# Patient Record
Sex: Male | Born: 1965 | Race: Black or African American | Hispanic: No | Marital: Married | Smoking: Former smoker
Health system: Southern US, Community
[De-identification: ages and names within clinical notes are randomized; demographics above are authoritative.]

## PROBLEM LIST (undated history)

## (undated) DIAGNOSIS — N529 Male erectile dysfunction, unspecified: Secondary | ICD-10-CM

## (undated) DIAGNOSIS — F1910 Other psychoactive substance abuse, uncomplicated: Secondary | ICD-10-CM

## (undated) DIAGNOSIS — A64 Unspecified sexually transmitted disease: Secondary | ICD-10-CM

## (undated) HISTORY — PX: VASECTOMY: SHX75

## (undated) HISTORY — PX: OTHER SURGICAL HISTORY: SHX169

## (undated) HISTORY — DX: Unspecified sexually transmitted disease: A64

## (undated) HISTORY — DX: Other psychoactive substance abuse, uncomplicated: F19.10

## (undated) HISTORY — DX: Male erectile dysfunction, unspecified: N52.9

---

## 1989-10-23 HISTORY — PX: OTHER SURGICAL HISTORY: SHX169

## 1993-10-23 DIAGNOSIS — F1910 Other psychoactive substance abuse, uncomplicated: Secondary | ICD-10-CM

## 1993-10-23 HISTORY — DX: Other psychoactive substance abuse, uncomplicated: F19.10

## 2009-10-27 ENCOUNTER — Ambulatory Visit: Payer: Self-pay | Admitting: Internal Medicine

## 2009-10-27 DIAGNOSIS — N529 Male erectile dysfunction, unspecified: Secondary | ICD-10-CM

## 2009-10-27 DIAGNOSIS — K649 Unspecified hemorrhoids: Secondary | ICD-10-CM | POA: Insufficient documentation

## 2009-10-27 DIAGNOSIS — R5383 Other fatigue: Secondary | ICD-10-CM

## 2009-10-27 DIAGNOSIS — R5381 Other malaise: Secondary | ICD-10-CM | POA: Insufficient documentation

## 2009-10-27 LAB — CONVERTED CEMR LAB
ALT: 14 units/L (ref 0–53)
BUN: 13 mg/dL (ref 6–23)
Basophils Absolute: 0 10*3/uL (ref 0.0–0.1)
Bilirubin, Direct: 0.2 mg/dL (ref 0.0–0.3)
Chloride: 105 meq/L (ref 96–112)
Cholesterol: 176 mg/dL (ref 0–200)
Eosinophils Relative: 2 % (ref 0–5)
Free T4: 1.16 ng/dL (ref 0.80–1.80)
Glucose, Bld: 87 mg/dL (ref 70–99)
HCT: 45.6 % (ref 39.0–52.0)
Hemoglobin: 15.5 g/dL (ref 13.0–17.0)
LDL Cholesterol: 114 mg/dL — ABNORMAL HIGH (ref 0–99)
Lymphocytes Relative: 54 % — ABNORMAL HIGH (ref 12–46)
Lymphs Abs: 2.1 10*3/uL (ref 0.7–4.0)
Monocytes Absolute: 0.4 10*3/uL (ref 0.1–1.0)
Neutro Abs: 1.3 10*3/uL — ABNORMAL LOW (ref 1.7–7.7)
Potassium: 4.4 meq/L (ref 3.5–5.3)
RBC: 5.28 M/uL (ref 4.22–5.81)
RDW: 13.7 % (ref 11.5–15.5)
Sodium: 140 meq/L (ref 135–145)
TSH: 2.611 microintl units/mL (ref 0.350–4.500)
Total CHOL/HDL Ratio: 3.5
Total Protein: 7.2 g/dL (ref 6.0–8.3)
Triglycerides: 59 mg/dL (ref <150)
VLDL: 12 mg/dL (ref 0–40)
WBC: 3.9 10*3/uL — ABNORMAL LOW (ref 4.0–10.5)

## 2009-10-28 ENCOUNTER — Encounter: Payer: Self-pay | Admitting: Internal Medicine

## 2009-10-28 ENCOUNTER — Telehealth: Payer: Self-pay | Admitting: Internal Medicine

## 2010-01-19 ENCOUNTER — Ambulatory Visit: Payer: Self-pay | Admitting: Internal Medicine

## 2010-01-19 DIAGNOSIS — J309 Allergic rhinitis, unspecified: Secondary | ICD-10-CM | POA: Insufficient documentation

## 2010-01-19 DIAGNOSIS — D696 Thrombocytopenia, unspecified: Secondary | ICD-10-CM | POA: Insufficient documentation

## 2010-01-19 LAB — CONVERTED CEMR LAB
Basophils Absolute: 0 10*3/uL (ref 0.0–0.1)
Basophils Relative: 0 % (ref 0–1)
Eosinophils Absolute: 0.1 10*3/uL (ref 0.0–0.7)
MCHC: 33.9 g/dL (ref 30.0–36.0)
MCV: 85.7 fL (ref 78.0–100.0)
Neutro Abs: 1.4 10*3/uL — ABNORMAL LOW (ref 1.7–7.7)
Neutrophils Relative %: 37 % — ABNORMAL LOW (ref 43–77)
Platelets: 137 10*3/uL — ABNORMAL LOW (ref 150–400)

## 2010-01-30 ENCOUNTER — Telehealth: Payer: Self-pay | Admitting: Internal Medicine

## 2010-01-30 DIAGNOSIS — D709 Neutropenia, unspecified: Secondary | ICD-10-CM

## 2010-01-31 ENCOUNTER — Encounter: Payer: Self-pay | Admitting: Internal Medicine

## 2010-06-22 ENCOUNTER — Telehealth: Payer: Self-pay | Admitting: Internal Medicine

## 2010-07-06 ENCOUNTER — Ambulatory Visit: Payer: Self-pay | Admitting: Internal Medicine

## 2010-07-06 DIAGNOSIS — M25519 Pain in unspecified shoulder: Secondary | ICD-10-CM

## 2010-07-07 ENCOUNTER — Telehealth: Payer: Self-pay | Admitting: Internal Medicine

## 2010-07-11 ENCOUNTER — Encounter: Payer: Self-pay | Admitting: Internal Medicine

## 2010-11-22 NOTE — Progress Notes (Addendum)
Summary: Rx request  Phone Note Call from Patient Call back at Home Phone [phone]   Caller: Patient Reason for Call: Talk to Nurse Summary of Call: Pt is requesting Rx for Zyrtec, is complaining of allergy symptoms; congestion Initial call taken by: Lannette Donath,  June 22, 2010 9:40 AM  Follow-up for Phone Call        call returned to patient at [phone], no answer. A voice message was left for patient to return call with symptoms that he is having.  Follow-up by: Glendell Docker CMA,  June 22, 2010 10:45 AM  Additional Follow-up for Phone Call Additional follow up Details #1::        patient is complaining nasal congestion, eyes puffiness, he states he does not think that he has allergy, but has taken Zyrtec in the past and used a nasal spray with relief. He would like to know if her could get a rx for zyrtec due to the otc cost, and a refil on the nasal spray Additional Follow-up by: Glendell Docker CMA,  June 22, 2010 11:02 AM    Additional Follow-up for Phone Call Additional follow up Details #2::    Fluticasone and zyrtec sent to pharmacy.  Pt should call if he develops fever, visual changes, if symptoms worsen or if they do not improve. Lemont Fillers FNP,  June 22, 2010 1:55 PM  Left message on machine to return my call. Nicki Guadalajara Fergerson CMA Duncan Dull)  June 22, 2010 3:35 PM   Pt returned my call and was notified per Provider's instructions. Nicki Guadalajara Fergerson CMA (AAMA)  June 22, 2010 3:42 PM   New/Updated Medications: ZYRTEC ALLERGY 10 MG TABS (CETIRIZINE HCL) one cap by mouth daily Prescriptions: ZYRTEC ALLERGY 10 MG TABS (CETIRIZINE HCL) one cap by mouth daily  #30 x 1   Entered and Authorized by:   Lemont Fillers FNP   Signed by:   Lemont Fillers FNP on 06/22/2010   Method used:   Electronically to        [facility] [id number] (retail)       [address] [place], [place]  [address]       Ph: [phone]    Fax: [phone]   RxID:   [id number] FLUTICASONE PROPIONATE 50 MCG/ACT SUSP (FLUTICASONE PROPIONATE) 2 sprays each nostril once daily  #1 x 0   Entered and Authorized by:   Lemont Fillers FNP   Signed by:   Lemont Fillers FNP on 06/22/2010   Method used:   Electronically to        [facility] [address] (retail)       [address]       [place], [place]  [address]       Ph: [phone]       Fax: [phone]   RxID:   [id number]

## 2010-11-22 NOTE — Assessment & Plan Note (Addendum)
Summary: 3 month follow up/mhf wife cancelled original appt in error   Vital Signs:  Patient profile:   45 year old Bruce [name] Height:      70.25 inches Weight:      165.25 pounds BMI:     23.63 O2 Sat:      98 % on Room air Temp:     98.0 degrees F oral Pulse rate:   56 / minute Pulse rhythm:   regular Resp:     16 per minute BP sitting:   110 / 80  (right arm) Cuff size:   large  Vitals Entered By: Glendell Docker CMA (January 19, 2010 9:08 AM)  O2 Flow:  Room air CC: Rm 3- 3 Month Follow up, URI symptoms Comments c/o difficulty sleeping due to sinus congestion, revisit ED concerns   Primary Care Provider:  DThomos Lemons DO  CC:  Rm 3- 3 Month Follow up and URI symptoms.  History of Present Illness:  URI Symptoms      This is a 45 year old man who presents with URI symptoms.  The patient reports nasal congestion, but denies purulent nasal discharge, sore throat, and earache.  The patient also reports seasonal symptoms.  He has difficulty sleeping at night due to nasal congestion.  ED - testosterone level is normal.   still having occ problems with erection quality.    reviewed prev labs - he has mild thrombocytopenia.  asymptomatic  Allergies (verified): No Known Drug Allergies  Past History:  Past Medical History: Unremarkable     Family History: Family History of CAD Bruce [name] 1st degree relative <Bruce Family History Hypertension Family History Kidney disease     Social History: Occupation: Insurance claims handler Married 14 years (wife Metallurgist) 5 daughters 39, 70, 8, 3,1  2 boys 11, 4     Physical Exam  General:  alert, well-developed, and well-nourished.   Ears:  R ear normal and L ear normal.   Nose:  nasal dischargemucosal pallor and mucosal edema.   Mouth:  postnasal drip and pharyngeal crowding.   Neck:  No deformities, masses, or tenderness noted. Lungs:  normal respiratory effort, normal breath sounds, no crackles, and no wheezes.   Heart:  normal rate, regular  rhythm, no murmur, and no gallop.     Impression & Recommendations:  Problem # 1:  ALLERGIC RHINITIS (ICD-477.9) Pt with chronic nasal congestion.   use otc zyrtec and flonase.  Patient advised to call office if symptoms persist or worsen.  His updated medication list for this problem includes:    Fluticasone Propionate Bruce Mcg/act Susp (Fluticasone propionate) .Marland Kitchen... 2 sprays each nostril once daily  Problem # 2:  THROMBOCYTOPENIA (ICD-287.5) Pt with mild thrombocytopenia.  repeat CBC Orders: T-CBC w/Diff ([id number])  Problem # 3:  ERECTILE DYSFUNCTION, ORGANIC (ICD-607.84) Testosterone levels normal.  Pt to consider trial of viagra or similar agent.  observe for now.  Complete Medication List: 1)  Fluticasone Propionate Bruce Mcg/act Susp (Fluticasone propionate) .... 2 sprays each nostril once daily  Patient Instructions: 1)  Use over the counter zyrtec 10 mg at bedtime daily 2)  Use breathe right nose strips. 3)  Call our office if your symptoms do not  improve or gets worse. 4)  Please schedule a follow-up appointment in 6 months. Prescriptions: FLUTICASONE PROPIONATE Bruce MCG/ACT SUSP (FLUTICASONE PROPIONATE) 2 sprays each nostril once daily  #1 x 3   Entered and Authorized by:   D. Thomos Lemons DO   Signed by:  Dondra Spry DO on 01/19/2010   Method used:   Electronically to        [facility] [address] (retail)       [address] [place], [place]  [address]       Ph: [phone]       Fax: [phone]   RxID:   [id number]   Current Allergies (reviewed today): No known allergies

## 2010-11-22 NOTE — Assessment & Plan Note (Addendum)
Summary: new to be est. - jr   Vital Signs:  Patient profile:   45 year old male Height:      70.25 inches Weight:      164.50 pounds BMI:     23.52 O2 Sat:      99 % on Room air Temp:     97.5 degrees F oral Pulse rate:   54 / minute Pulse rhythm:   regular Resp:     16 per minute BP sitting:   118 / 66  (right arm) Cuff size:   large  Vitals Entered By: Glendell Docker CMA (October 27, 2009 8:35 AM)  O2 Flow:  Room air  Primary Care Provider:  D. Thomos Lemons DO  CC:  New Patient.  History of Present Illness: New Patient  45 y/o AA male to establish.   no significant medical hx.   He works as Insurance claims handler.  He reports recent hx of possible ring warm on his face.  He washed with dandruff shampoo.  rash resolved.  he c/o chronic fatigue.   no chest pain or sob.    no sign wt change and change in appetite.   mild ED - difficulty with maintaining erection.  Preventive Screening-Counseling & Management  Alcohol-Tobacco     Alcohol drinks/day: 0     Alcohol Counseling: not indicated; patient does not drink     Smoking Status: quit     Packs/Day: 0.25     Year Started: 1986     Year Quit: 1995     Tobacco Counseling: not to resume use of tobacco products  Caffeine-Diet-Exercise     Caffeine use/day: 1beverage every other day     Caffeine Counseling: not indicated; caffeine use is not excessive or problematic     Does Patient Exercise: no  Allergies (verified): No Known Drug Allergies  Past History:  Past Medical History: Unremarkable  Family History: Family History of CAD Male 1st degree relative <50 Family History Hypertension Family History Kidney disease    Social History: 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         he has hx of hemorrhoids.   some intermittent blood on tissue paper  Physical Exam  General:  alert, well-developed, and well-nourished.   Head:  normocephalic and atraumatic.   Eyes:  pupils equal, pupils round, and pupils reactive to light.   Ears:  R ear normal and L ear normal.   Mouth:  Oral mucosa and oropharynx without lesions or exudates.  Teeth in good repair. Neck:  No deformities, masses, or tenderness noted.no carotid bruits.   Lungs:  Normal respiratory effort, chest expands symmetrically. Lungs are clear to auscultation, no crackles or wheezes. Heart:  Normal rate and regular rhythm. S1 and S2 normal without gallop, murmur, click, rub or other extra sounds. Abdomen:  soft, non-tender, normal bowel sounds, no masses, no hepatomegaly, and no splenomegaly.   Genitalia:  circumcised, no scrotal masses, no testicular masses or atrophy, and no urethral discharge.   Extremities:  No lower extremity edema  Skin:  color normal, no rashes, and no suspicious lesions.   Psych:  normally interactive, good  eye contact, not anxious appearing, and not depressed appearing.     Impression & Recommendations:  Problem # 1:  HEALTH MAINTENANCE EXAM (ICD-V70.0) Reviewed adult health maintenance protocols.   We discussed pros and cons of prostate ca screening.    If he elects screening - start age 45.  routine colonoscopy at age 45.  Problem # 2:  FATIGUE, CHRONIC (ICD-780.79) rule out metabolic disorder  Orders: T-Basic Metabolic Panel [phone]) T-Hepatic Function [id number]) T-Lipid Profile [id number]) T-CBC w/Diff [id number]) T-TSH [id number]) T-HIV Antibody  (Reflex) [id number]) T-T4, Free [id number])  Problem # 3:  ERECTILE DYSFUNCTION, ORGANIC (ICD-607.84) Pt with mild ED - poor erection quality.  check testosterone level.  if normal, we  discussed use of viagra.  Orders: T-Testosterone; Total [phone])  Problem # 4:  HEMORRHOIDS (ICD-455.6) Pt with intermittent blood on tissue.  Pt to start bult laxative.  pt advised to avoid straining.  complete stool cards in 2-4 wks when hemorrhoids improved.   Patient Instructions: 1)  Please schedule a follow-up appointment in 2 months. 2)  complete hemeoccult cards 3)  use bulk laxative daily 4)  increase fluid intake    Current Allergies (reviewed today): No known allergies

## 2010-11-22 NOTE — Assessment & Plan Note (Addendum)
Summary: 6 month follow up/mhf   Vital Signs:  Patient profile:   45 year old male Height:      70.25 inches Weight:      164.50 pounds BMI:     23.52 O2 Sat:      99 % on Room air Temp:     98.0 degrees F oral Pulse rate:   57 / minute Pulse rhythm:   regular Resp:     16 per minute BP sitting:   114 / 74  (right arm) Cuff size:   regular  Vitals Entered By: Glendell Docker CMA (July 06, 2010 9:04 AM)  O2 Flow:  Room air CC: 6 Month Follow up  Is Patient Diabetic? No Pain Assessment Patient in pain? no      Comments c/o bilateral shoulder pain in the morning for about 30 minutes, flu shot to be given with employer, refill on nasal spray   Primary Care Provider:  Dondra Spry DO  CC:  6 Month Follow up .  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   Lungs:  normal respiratory effort, normal breath sounds, no crackles, and no wheezes.   Heart:  normal rate, regular rhythm, no murmur, and no gallop.   Msk:  bilaterl shoulders:  normal ROM, no joint tenderness, no joint swelling, no joint warmth, no redness over joints, and no joint instability.     Impression & Recommendations:  Problem # 1:  SHOULDER PAIN,  BILATERAL (ICD-719.41) 45 y/o with AM shoulder stiffness.  no weakness on exam.   trial of OTC ibuprofen.  I doubt RA.  check labs. if persistent or worsening symptoms we discussed referral to rheumatologist  Orders: T-Antinuclear Antib (ANA) [id number]) T-C-Reactive Protein [id number]) T-Sed Rate (Automated) [id number]) T-Rheumatoid Factor [id number]) T-CK Total [phone])  Complete Medication List: 1)  Fluticasone Propionate 50 Mcg/act Susp (Fluticasone propionate) .... 2 sprays each nostril once daily  Patient Instructions: 1)  Call our office if your symptoms do not  improve or gets worse. 2)  Take ibuprofen 400 mg two times a day as needed with food and plenty of water x 1 week.  Current Allergies (reviewed today): No known allergies

## 2010-11-22 NOTE — Progress Notes (Addendum)
Summary: Lab Results  Phone Note Outgoing Call   Summary of Call: call pt - HIV screen is negative.  other labs normal.  I will send letter re: lab results Initial call taken by: D. Thomos Lemons DO,  October 28, 2009 10:23 AM  Follow-up for Phone Call        attempted to contact patient at [phone], no answer, no voice mail Follow-up by: Glendell Docker CMA,  October 28, 2009 2:05 PM  Additional Follow-up for Phone Call Additional follow up Details #1::        attempted to contacted patient wife states patient is not available. Message left for patient to return call Additional Follow-up by: Glendell Docker CMA,  October 29, 2009 1:00 PM    Additional Follow-up for Phone Call Additional follow up Details #2::    patient advised per Dr Artist Pais instructions Follow-up by: Glendell Docker CMA,  October 29, 2009 5:36 PM

## 2010-11-22 NOTE — Progress Notes (Addendum)
Summary: Lab Results  Phone Note Outgoing Call   Summary of Call: call pt - white blood cells and platelet count still slightly decreased.   I suggest we repeat in 6 months.   at next lab draw - add Hep B and Hep C antibody as well as cbcd Initial call taken by: D. Thomos Lemons DO,  January 30, 2010 7:30 AM  Follow-up for Phone Call        attempted to contact patient at [phone], recording rached stating the number had been disconnected. Letter has been mailed to patient  Follow-up by: Glendell Docker CMA,  January 31, 2010 9:28 AM  New Problems: NEUTROPENIA UNSPECIFIED (ICD-288.00)   New Problems: NEUTROPENIA UNSPECIFIED (ICD-288.00)

## 2010-11-22 NOTE — Letter (Addendum)
   [place] at [facility] [address] [place], [place]  [address]  [place] Phone: [phone]      October 28, 2009   [facility] [address] [place], [place] [address]  RE:  LAB RESULTS  Dear  Mr. Markgraf,  The following is an interpretation of your most recent lab tests.  Please take note of any instructions provided or changes to medications that have resulted from your lab work.  ELECTROLYTES:  Good - no changes needed  KIDNEY FUNCTION TESTS:  Good - no changes needed  LIVER FUNCTION TESTS:  Stable - no changes needed, Fair - review at your next visit  LIPID PANEL:  Good - no changes needed Triglyceride: 59   Cholesterol: 176   LDL: 114   HDL: 50   Chol/HDL%:  3.5 Ratio  THYROID STUDIES:  Thyroid studies normal TSH: 2.611     CBC:  Fair - review at your next visit   Testosterone level - normal       Sincerely Yours,    Dr. Thomos Lemons

## 2010-11-22 NOTE — Letter (Addendum)
Summary: Generic Letter  [place] at [facility]  [address]   [place], [place] [address]   Phone: [phone]  Fax: [phone]      01/31/2010      [facility] [address] [place], [place]  [address]        Dear Mr. Shehan,  I have attempted to contact you at [phone], however a voice recording was reached stating the number has been disconnected. Dr Artist Pais asked that you be informed that your white blood cells and platelet count is still slightly decreased. He would like for you to return in 6 months for blood work. Please contact our office with any questions and  to arrange this lab work.         Sincerely,   Glendell Docker CMA Dr. Thomos Lemons

## 2010-11-22 NOTE — Letter (Addendum)
Summary: Generic Letter  [place] at [facility]  [address]   [place], [place] [address]   Phone: [phone]  Fax: [phone]      07/11/2010    Taryll Krog [address] [place], [place]  [address]   Dear Mr. Cruces,  Dr Artist Pais recently reviewed your lab results.  Your tests for rheumatoid arthritis and muscle enzymes were normal. Please call [phone] Monday through Friday from 8am to 5pm if you have any questions.  Sincerely,    Mervin Kung CMA (AAMA)

## 2010-11-22 NOTE — Progress Notes (Addendum)
Summary: lab results  Phone Note Outgoing Call   Summary of Call: call pt - blood tests for rheumatoid arthritis, muscle enzymes - normal   Initial call taken by: D. Thomos Lemons DO,  July 07, 2010 3:18 PM  Follow-up for Phone Call        Left message for pt to return my call. Nicki Guadalajara Fergerson CMA Duncan Dull)  July 07, 2010 5:06 PM   Additional Follow-up for Phone Call Additional follow up Details #1::        Unable to reach pt by phone. Mailed normal lab letter. Nicki Guadalajara Fergerson CMA Duncan Dull)  July 11, 2010 2:05 PM

## 2010-12-28 ENCOUNTER — Ambulatory Visit (INDEPENDENT_AMBULATORY_CARE_PROVIDER_SITE_OTHER): Payer: 59 | Admitting: Internal Medicine

## 2010-12-28 ENCOUNTER — Encounter: Payer: Self-pay | Admitting: Internal Medicine

## 2010-12-28 ENCOUNTER — Encounter: Payer: 59 | Admitting: Internal Medicine

## 2010-12-28 DIAGNOSIS — Z0000 Encounter for general adult medical examination without abnormal findings: Secondary | ICD-10-CM

## 2010-12-28 DIAGNOSIS — F524 Premature ejaculation: Secondary | ICD-10-CM | POA: Insufficient documentation

## 2010-12-28 DIAGNOSIS — K649 Unspecified hemorrhoids: Secondary | ICD-10-CM

## 2010-12-28 LAB — CONVERTED CEMR LAB
BUN: 12 mg/dL (ref 6–23)
Basophils Relative: 0 % (ref 0–1)
CRP, High Sensitivity: 0.4
Chloride: 103 meq/L (ref 96–112)
Creatinine, Ser: 1.06 mg/dL (ref 0.40–1.50)
Eosinophils Relative: 3 % (ref 0–5)
HCT: 44.6 % (ref 39.0–52.0)
Hemoglobin: 15 g/dL (ref 13.0–17.0)
LDL Cholesterol: 91 mg/dL (ref 0–99)
MCHC: 33.6 g/dL (ref 30.0–36.0)
MCV: 88 fL (ref 78.0–100.0)
Monocytes Absolute: 0.4 10*3/uL (ref 0.1–1.0)
Monocytes Relative: 11 % (ref 3–12)
Neutro Abs: 1.2 10*3/uL — ABNORMAL LOW (ref 1.7–7.7)
RBC: 5.07 M/uL (ref 4.22–5.81)
Triglycerides: 60 mg/dL (ref <150)

## 2010-12-29 ENCOUNTER — Encounter: Payer: Self-pay | Admitting: Internal Medicine

## 2010-12-29 LAB — CONVERTED CEMR LAB
HCV Ab: NEGATIVE
Hep B S Ab: NEGATIVE

## 2010-12-30 ENCOUNTER — Encounter: Payer: Self-pay | Admitting: Internal Medicine

## 2011-01-03 NOTE — Letter (Addendum)
   [place] at [facility] [address] [place], [place]  [address]  [place] Phone: [phone]      December 30, 2010   Bruce [name] [address] [place], [place] [address]  RE:  LAB RESULTS  Dear  [name],  The following is an interpretation of your most recent lab tests.  Please take note of any instructions provided or changes to medications that have resulted from your lab work.  ELECTROLYTES:  Good - no changes needed  KIDNEY FUNCTION TESTS:  Good - no changes needed  LIVER FUNCTION TESTS:  Stable - no changes needed  LIPID PANEL:  Good - no changes needed Triglyceride: 60   Cholesterol: 150   LDL: 91   HDL: 47   Chol/HDL%:  3.2 Ratio   CBC:  Stable - no changes needed  Hep B and C antibody - negative       Sincerely Yours,    Dr. Thomos Lemons  Appended Document:  mailed

## 2011-01-19 NOTE — Assessment & Plan Note (Signed)
Summary: CPX   Vital Signs:  Patient profile:   45 year old male Height:      70.25 inches Weight:      159 pounds BMI:     22.73 O2 Sat:      100 % on Room air Temp:     97.8 degrees F oral Pulse rate:   51 / minute Resp:     18 per minute BP sitting:   90 / 70  (right arm) Cuff size:   large  Vitals Entered By: Glendell Docker CMA (December 28, 2010 9:12 AM)  O2 Flow:  Room air  Primary Care Provider:  D. Thomos Lemons DO   History of Present Illness: 45 y/o male for routine no significant int med hx    he complains premature ejaculation  tried viagra in the past -  caused severe headaches - he can not tolerate  Preventive Screening-Counseling & Management  Alcohol-Tobacco     Alcohol drinks/day: 0     Smoking Status: quit  Caffeine-Diet-Exercise     Does Patient Exercise: no  Allergies (verified): No Known Drug Allergies  Past History:  Past Medical History: Unremarkable        Family History: Family History of CAD Male - MGF  Family History Hypertension Family History Kidney disease - father (due to drug use) uncle with leukemia aunt with lupus      Social History: 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  The patient denies fever, weight loss, weight gain, chest pain, syncope, dyspnea on exertion, prolonged cough, severe indigestion/heartburn, depression, and testicular masses.         no regular  exercise  helped neighbor move furniture - no chest pain or dizziness  intermittent rectal bleeding   Physical Exam  General:  alert, well-developed, and well-nourished.   Head:  normocephalic and atraumatic.   Eyes:  pupils equal, pupils round, and pupils reactive to light.   Ears:  R ear normal and L ear normal.   Mouth:  pharynx pink and moist.   Neck:  No deformities, masses, or tenderness noted.no carotid  bruits.   Lungs:  normal respiratory effort, normal breath sounds, no crackles, and no wheezes.   Heart:  normal rate, regular rhythm, no murmur, and no gallop.   Abdomen:  soft, non-tender, normal bowel sounds, no inguinal hernia, no hepatomegaly, and no splenomegaly.   Rectal:  external hemorrhoid(s).   Genitalia:  no hydrocele, no varicocele, no scrotal masses, and no testicular masses or atrophy.   Extremities:  No lower extremity edema Neurologic:  alert & oriented X3, cranial nerves II-XII intact, and gait normal.   Psych:  normally interactive, good eye contact, not anxious appearing, and not depressed appearing.     Impression & Recommendations:  Problem # 1:  HEALTH MAINTENANCE EXAM (ICD-V70.0) Reviewed adult health maintenance protocols.  Orders: T-Lipid Profile [id number]) CRP, high sensitivity-[facility] ([id number])  Chol: 176 (10/27/2009)   HDL: 50 (10/27/2009)   LDL: 114 (10/27/2009)   TG: 59 (10/27/2009) TSH: 2.611 (10/27/2009)     Problem # 2:  HEMORRHOIDS (ICD-455.6) pt with intermittent bleeding hemorrhoids ? 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 One by mouth once daily as directed  Other Orders: T-CBC w/Diff ([id number]) T-Basic Metabolic Panel ([id number])  Patient Instructions: 1)  Please schedule a follow-up appointment in 3 months. Prescriptions: CITALOPRAM HYDROBROMIDE 10 MG TABS (CITALOPRAM HYDROBROMIDE) one by mouth once daily as directed  #30 x 1   Entered and Authorized by:   D. Thomos Lemons DO   Signed by:   D. Thomos Lemons DO on 12/28/2010   Method used:   Electronically to        [facility] [address] (retail)       [address] [place], [place]  [address]       Ph:  [phone]       Fax: [phone]   RxID:   [id number]    Orders Added: 1)  T-CBC w/Diff [[id number]] 2)  T-Basic Metabolic Panel [id number] 3)  T-Lipid Profile [[id number]] 4)  CRP, high sensitivity-[facility] [phone] 5)  Surgical Referral [Surgery] 6)  Est. Patient 40-64 years [99396] 7)  Est. Patient Level III [93235]    Current Allergies (reviewed today): No known allergies

## 2011-02-21 ENCOUNTER — Telehealth: Payer: Self-pay | Admitting: Internal Medicine

## 2011-02-21 NOTE — Telephone Encounter (Signed)
Opened in error

## 2011-03-29 ENCOUNTER — Telehealth: Payer: Self-pay | Admitting: Internal Medicine

## 2011-03-29 ENCOUNTER — Ambulatory Visit: Payer: 59 | Admitting: Internal Medicine

## 2011-03-29 NOTE — Telephone Encounter (Signed)
Pt states that he received a bill from [facility] for $367.72 stating that the CPT code that was used is being rejected. Pt wants to know if another CPT code could be used in order for insurance to pay??

## 2011-03-31 NOTE — Telephone Encounter (Signed)
Call placed to patient at [phone], patients 45 year old daughter Janelle Floor answered and stated patient was not home he has left for work and her mother has left to drop patient to school.   Attempted to call back to verify if child was home alone; the phone line has continued to ring busy.

## 2011-04-03 NOTE — Telephone Encounter (Signed)
Pt returned phone call. CPT codes that is being denied are [address], and [address]. Best# [phone].

## 2011-04-10 NOTE — Telephone Encounter (Signed)
Call placed to patient at [phone], no answer, no voice mail

## 2011-04-12 ENCOUNTER — Ambulatory Visit: Payer: Self-pay | Admitting: Internal Medicine

## 2011-04-13 NOTE — Telephone Encounter (Signed)
Call placed to patient at [phone], male stated patient was not available. Message was left for patient to return phone call regarding change in codes.

## 2011-04-17 NOTE — Telephone Encounter (Signed)
No return call from patient.

## 2011-04-20 ENCOUNTER — Encounter: Payer: Self-pay | Admitting: Family Medicine

## 2011-05-17 ENCOUNTER — Ambulatory Visit (INDEPENDENT_AMBULATORY_CARE_PROVIDER_SITE_OTHER): Payer: 59 | Admitting: Internal Medicine

## 2011-05-17 ENCOUNTER — Ambulatory Visit: Payer: 59 | Admitting: Internal Medicine

## 2011-05-17 ENCOUNTER — Encounter: Payer: Self-pay | Admitting: Internal Medicine

## 2011-05-17 DIAGNOSIS — IMO0001 Reserved for inherently not codable concepts without codable children: Secondary | ICD-10-CM

## 2011-05-17 DIAGNOSIS — N529 Male erectile dysfunction, unspecified: Secondary | ICD-10-CM

## 2011-05-17 DIAGNOSIS — R6882 Decreased libido: Secondary | ICD-10-CM

## 2011-05-17 DIAGNOSIS — M791 Myalgia, unspecified site: Secondary | ICD-10-CM

## 2011-05-17 DIAGNOSIS — M25519 Pain in unspecified shoulder: Secondary | ICD-10-CM

## 2011-05-17 LAB — TESTOSTERONE: Testosterone: 444.17 ng/dL (ref 250–890)

## 2011-05-17 NOTE — Assessment & Plan Note (Signed)
Suspect underlying DJD however  Myalgias we'll obtain CK

## 2011-05-17 NOTE — Progress Notes (Signed)
  Subjective:    Patient ID: Bruce [name], male    DOB: 10/05/1965, 45 y.o.   MRN: [id number]  HPI Patient presents to clinic for evaluation of decreased libido. Attempted Celexa because of premature ejaculation however stopped because of headaches. Previous attempts of Viagra for erectile dysfunction resulted in headaches as well. Notes decreased libido and recalls normal testosterone remotely. Complains of chronic intermittent upper trapezius muscle pain. Occurs in the morning with stiffness and has difficulty raising arms then symptoms resolve. Wonders if related to degeneration and hard work in the past. Also wonders if may be related to his mattress. Improves with exercise. No other exacerbating or alleviating factors. No other complaints.  Reviewed past medical history, medications and allergies  Review of Systems see history of present illness     Objective:   Physical Exam  Nursing note and vitals reviewed. Constitutional: He appears well-developed and well-nourished. No distress.  HENT:  Head: Normocephalic and atraumatic.  Eyes: Conjunctivae are normal. No scleral icterus.  Musculoskeletal:       Full range of motion bilateral shoulders. Full range of motion neck. Nontender posterior neck and trapezius muscles.  Neurological: He is alert.  Skin: Skin is warm and dry. He is not diaphoretic.  Psychiatric: He has a normal mood and affect.          Assessment & Plan:

## 2011-05-17 NOTE — Assessment & Plan Note (Signed)
With associated decreased libido Will repeat a.m. testosterone

## 2012-04-15 ENCOUNTER — Ambulatory Visit (INDEPENDENT_AMBULATORY_CARE_PROVIDER_SITE_OTHER): Payer: 59 | Admitting: Emergency Medicine

## 2012-04-15 ENCOUNTER — Ambulatory Visit: Payer: 59

## 2012-04-15 VITALS — BP 109/68 | HR 49 | Temp 98.2°F | Resp 16 | Ht 70.5 in | Wt 162.4 lb

## 2012-04-15 DIAGNOSIS — M549 Dorsalgia, unspecified: Secondary | ICD-10-CM

## 2012-04-15 DIAGNOSIS — M79609 Pain in unspecified limb: Secondary | ICD-10-CM

## 2012-04-15 DIAGNOSIS — M5430 Sciatica, unspecified side: Secondary | ICD-10-CM

## 2012-04-15 MED ORDER — MELOXICAM 7.5 MG PO TABS: ORAL_TABLET | ORAL | Status: DC

## 2012-04-15 MED ORDER — CYCLOBENZAPRINE HCL 10 MG PO TABS: ORAL_TABLET | ORAL | Status: DC

## 2012-04-15 MED ORDER — HYDROCODONE-ACETAMINOPHEN 5-325 MG PO TABS: 1.0000 | ORAL_TABLET | ORAL | Status: AC | PRN

## 2012-04-15 NOTE — Patient Instructions (Signed)

## 2012-04-15 NOTE — Progress Notes (Signed)
  Subjective:    Patient ID: Bruce [name], male    DOB: [birth year], 46 y.o.   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 He now complains of pain with movement twisting or turning. He has pain when he bends forward. He has no urinary or bowel symptoms. He has no history of back problems and has had no back surgery.    Review of Systems     Objective:   Physical Exam is tenderness over L5-S1 on the right. Straight leg raising was negative bilaterally. Motor strength is 5 out of 5 all muscle groups. Patient has pain with twisting of the back.  [facility] reading (PRIMARY) by  Dr.Paola Aleshire is some narrowing of L5-S1 but no fractures are seen        Assessment & Plan:  Patient has signs of an L/S strain with sciatica. Will treat with Mobic Flexeril at bedtime and pain medication as needed recheck one week if symptoms continue. He was given a note for one week.

## 2014-02-04 ENCOUNTER — Other Ambulatory Visit (INDEPENDENT_AMBULATORY_CARE_PROVIDER_SITE_OTHER): Payer: 59

## 2014-02-04 DIAGNOSIS — Z0000 Encounter for general adult medical examination without abnormal findings: Secondary | ICD-10-CM

## 2014-02-04 LAB — POCT URINALYSIS DIPSTICK
BILIRUBIN UA: NEGATIVE
Glucose, UA: NEGATIVE
KETONES UA: NEGATIVE
Leukocytes, UA: NEGATIVE
Nitrite, UA: NEGATIVE
PH UA: 7
Protein, UA: NEGATIVE
RBC UA: NEGATIVE
SPEC GRAV UA: 1.02
Urobilinogen, UA: 0.2

## 2014-02-04 LAB — HEPATIC FUNCTION PANEL
ALT: 12 U/L (ref 0–53)
AST: 21 U/L (ref 0–37)
Albumin: 4.1 g/dL (ref 3.5–5.2)
Alkaline Phosphatase: 41 U/L (ref 39–117)
BILIRUBIN DIRECT: 0.1 mg/dL (ref 0.0–0.3)
BILIRUBIN TOTAL: 1 mg/dL (ref 0.3–1.2)
Total Protein: 6.7 g/dL (ref 6.0–8.3)

## 2014-02-04 LAB — CBC WITH DIFFERENTIAL/PLATELET
BASOS ABS: 0 10*3/uL (ref 0.0–0.1)
BASOS PCT: 0.5 % (ref 0.0–3.0)
EOS ABS: 0.1 10*3/uL (ref 0.0–0.7)
Eosinophils Relative: 1.7 % (ref 0.0–5.0)
HCT: 43.1 % (ref 39.0–52.0)
Hemoglobin: 14.3 g/dL (ref 13.0–17.0)
LYMPHS PCT: 53.5 % — AB (ref 12.0–46.0)
Lymphs Abs: 1.8 10*3/uL (ref 0.7–4.0)
MCHC: 33.1 g/dL (ref 30.0–36.0)
MCV: 88.7 fl (ref 78.0–100.0)
Monocytes Absolute: 0.3 10*3/uL (ref 0.1–1.0)
Monocytes Relative: 9.1 % (ref 3.0–12.0)
NEUTROS PCT: 35.2 % — AB (ref 43.0–77.0)
Neutro Abs: 1.2 10*3/uL — ABNORMAL LOW (ref 1.4–7.7)
Platelets: 142 10*3/uL — ABNORMAL LOW (ref 150.0–400.0)
RBC: 4.86 Mil/uL (ref 4.22–5.81)
RDW: 13.9 % (ref 11.5–14.6)
WBC: 3.4 10*3/uL — ABNORMAL LOW (ref 4.5–10.5)

## 2014-02-04 LAB — BASIC METABOLIC PANEL
BUN: 15 mg/dL (ref 6–23)
CALCIUM: 9.4 mg/dL (ref 8.4–10.5)
CO2: 29 meq/L (ref 19–32)
CREATININE: 0.9 mg/dL (ref 0.4–1.5)
Chloride: 106 mEq/L (ref 96–112)
GFR: 115.69 mL/min (ref >60.00)
Glucose, Bld: 86 mg/dL (ref 70–99)
Potassium: 4.5 mEq/L (ref 3.5–5.1)
Sodium: 138 mEq/L (ref 135–145)

## 2014-02-04 LAB — LIPID PANEL
Cholesterol: 167 mg/dL (ref 0–200)
HDL: 41.3 mg/dL (ref >39.00)
LDL Cholesterol: 118 mg/dL — ABNORMAL HIGH (ref 0–99)
TRIGLYCERIDES: 37 mg/dL (ref 0.0–149.0)
Total CHOL/HDL Ratio: 4
VLDL: 7.4 mg/dL (ref 0.0–40.0)

## 2014-02-04 LAB — PSA: PSA: 0.36 ng/mL (ref 0.10–4.00)

## 2014-02-04 LAB — TSH: TSH: 2.43 u[IU]/mL (ref 0.35–5.50)

## 2014-02-11 ENCOUNTER — Encounter: Payer: Self-pay | Admitting: Internal Medicine

## 2014-02-11 ENCOUNTER — Ambulatory Visit (INDEPENDENT_AMBULATORY_CARE_PROVIDER_SITE_OTHER): Payer: 59 | Admitting: Internal Medicine

## 2014-02-11 VITALS — BP 96/68 | HR 68 | Temp 98.6°F | Ht 71.0 in | Wt 166.0 lb

## 2014-02-11 DIAGNOSIS — Z23 Encounter for immunization: Secondary | ICD-10-CM

## 2014-02-11 DIAGNOSIS — K621 Rectal polyp: Secondary | ICD-10-CM

## 2014-02-11 DIAGNOSIS — K649 Unspecified hemorrhoids: Secondary | ICD-10-CM

## 2014-02-11 DIAGNOSIS — Z0000 Encounter for general adult medical examination without abnormal findings: Secondary | ICD-10-CM

## 2014-02-11 NOTE — Progress Notes (Signed)
Pre visit review using our clinic review tool, if applicable. No additional management support is needed unless otherwise documented below in the visit note. 

## 2014-02-11 NOTE — Progress Notes (Signed)
   Subjective:    Patient ID: Bruce [name], male    DOB: Mar 14, 1966, 48 y.o.   MRN: [id number]  HPI  48 year old [place] American male for routine physical. Patient denies any significant interval medical history. He does not have any chronic illnesses. He takes no prescription medications.  Social and family history reviewed and updated.  Review of Systems  Constitutional: Negative for activity change, appetite change and unexpected weight change.  Eyes: Negative for visual disturbance.  Respiratory: Negative for cough, chest tightness and shortness of breath.   Cardiovascular: Negative for chest pain.  Genitourinary: Negative for difficulty urinating.  Neurological: Negative for headaches.  Gastrointestinal: Negative for abdominal pain, intermittent rectal bleeding.  He attributes to hemorrhoids Psych: Negative for depression or anxiety Endo:  No sexual dysfunction.  Hx of genital warts.        No past medical history on file.  History   Social History  . Marital Status: Married    Spouse Name: Nocita    Number of Children: 7  . Years of Education: N/A   Occupational History  . Sanitation Worker     [employer] History Main Topics  . Smoking status: Former Research scientist (life sciences)  . Smokeless tobacco: Not on file  . Alcohol Use: No  . Drug Use: No  . Sexual Activity: Not on file   Other Topics Concern  . Not on file   Social History Narrative   5 daughters, 2 sons   Quit smoking over 20 years ago          No past surgical history on file.  Family History  Problem Relation Age of Onset  . Kidney disease Father     due to drug use  . Coronary artery disease Maternal Grandfather   . Hypertension Other   . Cancer Other     Uncle-Leukemia  . Lupus Other     Aunt    No Known Allergies  No current outpatient prescriptions on file prior to visit.   No current facility-administered medications on file prior to visit.    BP 96/68  Pulse 68   Temp(Src) 98.6 F (37 C) (Oral)  Ht 5\' 11"  (1.803 m)  Wt 166 lb (75.297 kg)  BMI 23.16 kg/m2     Objective:   Physical Exam  Constitutional: He is oriented to person, place, and time. He appears well-developed and well-nourished. No distress.  HENT:  Head: Normocephalic and atraumatic.  Right Ear: External ear normal.  Left Ear: External ear normal.  Mouth/Throat: Oropharynx is clear and moist.  Eyes: Conjunctivae and EOM are normal. Pupils are equal, round, and reactive to light.  Neck: Neck supple.  Cardiovascular: Normal rate, regular rhythm, normal heart sounds and intact distal pulses.   No murmur heard. Pulmonary/Chest: Effort normal and breath sounds normal. He has no wheezes.  Abdominal: Soft. Bowel sounds are normal. There is no tenderness.  Genitourinary: Penis normal.  External hemorrhoids, normal prostate Question rectal polyp (11:00 position)  Musculoskeletal: He exhibits no edema.  Lymphadenopathy:    He has no cervical adenopathy.  Neurological: He is alert and oriented to person, place, and time. No cranial nerve deficit.  Skin: Skin is warm and dry.  Psychiatric: He has a normal mood and affect. His behavior is normal.        Assessment & Plan:

## 2014-02-11 NOTE — Assessment & Plan Note (Signed)
He reports bleeding hemorrhoids.  He has possible rectal polyp on exam.  Refer to GI for colonoscopy.  Patient advised to use Psyllium fiber supplement regularly.

## 2014-02-11 NOTE — Assessment & Plan Note (Signed)
Reviewed adult health maintenance protocols.  Patient updated with Tdap.  He weight and blood pressure are normal.  Screening labs normal.  DRE and PSA normal.

## 2014-02-11 NOTE — Patient Instructions (Signed)
Take Psyllium fiber laxative daily (Metamucil) Soak in Epsom salt solution when you're having a flare up of painful hemorrhoids

## 2014-03-26 ENCOUNTER — Ambulatory Visit: Payer: 59 | Admitting: Internal Medicine

## 2014-04-03 ENCOUNTER — Encounter: Payer: Self-pay | Admitting: Physician Assistant

## 2014-04-03 ENCOUNTER — Ambulatory Visit (INDEPENDENT_AMBULATORY_CARE_PROVIDER_SITE_OTHER): Payer: 59 | Admitting: Physician Assistant

## 2014-04-03 VITALS — BP 124/72 | HR 66 | Ht 71.0 in | Wt 164.0 lb

## 2014-04-03 DIAGNOSIS — K621 Rectal polyp: Secondary | ICD-10-CM

## 2014-04-03 DIAGNOSIS — K620 Anal polyp: Secondary | ICD-10-CM

## 2014-04-03 DIAGNOSIS — Z1211 Encounter for screening for malignant neoplasm of colon: Secondary | ICD-10-CM

## 2014-04-03 DIAGNOSIS — K5900 Constipation, unspecified: Secondary | ICD-10-CM

## 2014-04-03 MED ORDER — HYDROCORTISONE ACETATE 25 MG RE SUPP: RECTAL | Status: DC

## 2014-04-03 MED ORDER — PEG-KCL-NACL-NASULF-NA ASC-C 100 G PO SOLR: 1.0000 | Freq: Once | ORAL | Status: AC

## 2014-04-03 NOTE — Patient Instructions (Addendum)
We have sent the following medications to your pharmacy for you to pick up at your convenience: Ansuol suppositories.  You have been scheduled for a colonoscopy with propofol. Please follow written instructions given to you at your visit today.  Please pick up your prep kit at the pharmacy within the next 1-3 days.  Please increase fiber in your diet and increase water intake.

## 2014-04-03 NOTE — Progress Notes (Signed)
Subjective:    Patient ID: Bruce [name], male    DOB: [birth year], 48 y.o.   MRN: [id number]  HPI  Bruce [name] is a pleasant 48 year old African American male generally in good health who is referred by Dr. Shawna Orleans for consideration of colonoscopy. Patient had had a recent physical and had complained of constipation and intermittent rectal bleeding which she says generally is only present when he is constipated. He has no regular complaints of abdominal pain and says that if he drinks more water and eat screens ,he does well. Patient says that he has noticed small amounts of bright red blood per the manner and we over the past couple of years. He also reports that he had some sort of hemorrhoidal procedure done in [place] a few years back but does not remember which physician did that. He is does not feel that he's been seen by a gastroenterologist before and has not had any prior colonoscopy. On rectal exam Dr. Shawna Orleans was concerned about an internal rectal polyp. Family history is negative for colon cancer ,polyps.    Review of Systems  Constitutional: Negative.   Eyes: Negative.   Respiratory: Negative.   Cardiovascular: Negative.   Gastrointestinal: Positive for constipation, anal bleeding and rectal pain.  Endocrine: Negative.   Genitourinary: Negative.   Musculoskeletal: Negative.   Skin: Negative.   Allergic/Immunologic: Negative.   Neurological: Negative.   Hematological: Negative.   Psychiatric/Behavioral: Negative.    No outpatient prescriptions prior to visit.   No facility-administered medications prior to visit.      No Known Allergies Patient Active Problem List   Diagnosis Date Noted  . Preventative health care 02/11/2014  . PREMATURE EJACULATION 12/28/2010  . SHOULDER PAIN, BILATERAL 07/06/2010  . NEUTROPENIA UNSPECIFIED 01/30/2010  . THROMBOCYTOPENIA 01/19/2010  . ALLERGIC RHINITIS 01/19/2010  . HEMORRHOIDS 10/27/2009  . 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 FATIGUE, CHRONIC 10/27/2009   History  Substance Use Topics  . Smoking status: Former Research scientist (life sciences)  . Smokeless tobacco: Not on file  . Alcohol Use: No   family history includes Cancer in his other; Coronary artery disease in his maternal grandfather; Hypertension in his other; Kidney disease in his father; Lupus in his other.  Objective:   Physical Exam  Well-developed African -American male in no acute distress, pleasant blood pressure 124/72 pulse 66 height 5 foot 11 weight 164. HEENT ;nontraumatic normocephalic EOMI PERRLA sclera anicteric, Supple ;no JVD, Cardiovascular; regular rate and rhythm with S1-S2 no murmur or gallop, Pulmonary; clear bilaterally, Abdomen; soft nontender nondistended no palpable mass or hepatosplenomegaly bowel sounds are present, Rectal ;exam not repeated this was done per Dr. Shawna Orleans last week with question of internal rectal polyp. Extremities ;no clubbing cyanosis or edema skin warm dry, Psych; mood and affect appropriate     Assessment & Plan:  #85  48 year old male with intermittent rectal bleeding, mild constipation and recent abnormal rectal exam by PCP with concern for rectal polyp.-Rule out internal hemorrhoids, rectal polyp or occult neoplasm #2 prior history of hemorrhoids  Plan; patient is scheduled for colonoscopy with Dr. Henrene Pastor. Procedure discussed in detail with the patient and he is agreeable to proceed Anusol-HC suppositories each bedtime x7 days and repeat as needed for rectal discomfort or bleeding Patient signed a release and we have called [facility], [facility] and Dr.Mann/Hung's office, and [facility]- none of whom have record of this patient being seen- though he says that hemorrhoid procedure was done in [place].  Plan

## 2014-04-03 NOTE — Progress Notes (Signed)
Agree with initial assessment and plans as outlined 

## 2014-04-10 ENCOUNTER — Encounter: Payer: Self-pay | Admitting: Internal Medicine

## 2014-04-29 ENCOUNTER — Encounter: Payer: Self-pay | Admitting: Internal Medicine

## 2014-04-29 ENCOUNTER — Ambulatory Visit (AMBULATORY_SURGERY_CENTER): Payer: 59 | Admitting: Internal Medicine

## 2014-04-29 VITALS — BP 114/71 | HR 58 | Temp 97.1°F | Resp 20 | Ht 71.0 in | Wt 164.0 lb

## 2014-04-29 DIAGNOSIS — K648 Other hemorrhoids: Secondary | ICD-10-CM

## 2014-04-29 DIAGNOSIS — K625 Hemorrhage of anus and rectum: Secondary | ICD-10-CM

## 2014-04-29 DIAGNOSIS — Z1211 Encounter for screening for malignant neoplasm of colon: Secondary | ICD-10-CM

## 2014-04-29 MED ORDER — SODIUM CHLORIDE 0.9 % IV SOLN
500.0000 mL | INTRAVENOUS | Status: DC
Start: 2014-04-29 — End: 2014-04-30

## 2014-04-29 NOTE — Op Note (Signed)
[facility]  [address] [place], [id number]   COLONOSCOPY PROCEDURE REPORT  PATIENT: Bruce [name], Bruce [name]  MR#: [id number] BIRTHDATE: Oct 03, 1966 , 47  yrs. old GENDER: Male ENDOSCOPIST: Eustace Quail, MD REFERRED WG:[id number] Shawna Orleans, DO PROCEDURE DATE:  04/29/2014 PROCEDURE:   Colonoscopy, diagnostic First Screening Colonoscopy - Avg.  risk and is 50 yrs.  old or older - No.  Prior Negative Screening - Now for repeat screening. N/A  History of Adenoma - Now for follow-up colonoscopy & has been > or = to 3 yrs.  N/A  Polyps Removed Today? No.  Recommend repeat exam, <10 yrs? No. ASA CLASS:   Class I INDICATIONS:rectal bleeding.  questionable polyp on rectal exam MEDICATIONS: MAC sedation, administered by CRNA and propofol (Diprivan) 300mg  IV  DESCRIPTION OF PROCEDURE:   After the risks benefits and alternatives of the procedure were thoroughly explained, informed consent was obtained.  A digital rectal exam revealed no abnormalities of the rectum. Palpable internal hemorrhoids noted. The LB [id number]  endoscope was introduced through the anus and advanced to the cecum, which was identified by both the appendix and ileocecal valve. No adverse events experienced.   The quality of the prep was excellent, using MoviPrep  The instrument was then slowly withdrawn as the colon was fully examined.      COLON FINDINGS: A normal appearing cecum, ileocecal valve, and appendiceal orifice were identified.  The ascending, hepatic flexure, transverse, splenic flexure, descending, sigmoid colon and rectum appeared unremarkable.  No polyps or cancers were seen. Retroflexed views revealed internal hemorrhoids. The time to cecum=9 minutes 13 seconds.  Withdrawal time=7 minutes 46 seconds. The scope was withdrawn and the procedure completed.  COMPLICATIONS: There were no complications.  ENDOSCOPIC IMPRESSION: 1.Normal colon 2. Friable  hemorrhoids  RECOMMENDATIONS: 1. Continue current colorectal screening recommendations for "routine risk" patients with a repeat colonoscopy in 10 years. 2. Continue with medicated suppositories as needed 3. Daily fiber supplementation with Metamucil one to 2 tablespoons in 12-14 ounces of water or juice   eSigned:  Eustace Quail, MD 04/29/2014 4:47 PM   cc: Drema Pry, DO and The Patient

## 2014-04-29 NOTE — Patient Instructions (Signed)
Normal colon, hemorrhoids. Handout given on hemorrhoids. Repeat colonoscopy in 10 years. Resume current medications. Daily fiber use OTC Metamucil 1-2 tablespoons in 12-14 ounces of water or juice. Call us with any questions. Thank you!  YOU HAD AN ENDOSCOPIC PROCEDURE TODAY AT [facility]: Refer to the procedure report that was given to you for any specific questions about what was found during the examination.  If the procedure report does not answer your questions, please call your gastroenterologist to clarify.  If you requested that your care partner not be given the details of your procedure findings, then the procedure report has been included in a sealed envelope for you to review at your convenience later.  YOU SHOULD EXPECT: Some feelings of bloating in the abdomen. Passage of more gas than usual.  Walking can help get rid of the air that was put into your GI tract during the procedure and reduce the bloating. If you had a lower endoscopy (such as a colonoscopy or flexible sigmoidoscopy) you may notice spotting of blood in your stool or on the toilet paper. If you underwent a bowel prep for your procedure, then you may not have a normal bowel movement for a few days.  DIET: Your first meal following the procedure should be a light meal and then it is ok to progress to your normal diet.  A half-sandwich or bowl of soup is an example of a good first meal.  Heavy or fried foods are harder to digest and may make you feel nauseous or bloated.  Likewise meals heavy in dairy and vegetables can cause extra gas to form and this can also increase the bloating.  Drink plenty of fluids but you should avoid alcoholic beverages for 24 hours.  ACTIVITY: Your care partner should take you home directly after the procedure.  You should plan to take it easy, moving slowly for the rest of the day.  You can resume normal activity the day after the procedure however you should NOT DRIVE or use heavy  machinery for 24 hours (because of the sedation medicines used during the test).    SYMPTOMS TO REPORT IMMEDIATELY: A gastroenterologist can be reached at any hour.  During normal business hours, 8:30 AM to 5:00 PM Monday through Friday, call [phone].  After hours and on weekends, please call the GI answering service at [phone] who will take a message and have the physician on call contact you.   Following lower endoscopy (colonoscopy or flexible sigmoidoscopy):  Excessive amounts of blood in the stool  Significant tenderness or worsening of abdominal pains  Swelling of the abdomen that is new, acute  Fever of 100F or higher  Following upper endoscopy (EGD)  Vomiting of blood or coffee ground material  New chest pain or pain under the shoulder blades  Painful or persistently difficult swallowing  New shortness of breath  Fever of 100F or higher  Black, tarry-looking stools  FOLLOW UP: If any biopsies were taken you will be contacted by phone or by letter within the next 1-3 weeks.  Call your gastroenterologist if you have not heard about the biopsies in 3 weeks.  Our staff will call the home number listed on your records the next business day following your procedure to check on you and address any questions or concerns that you may have at that time regarding the information given to you following your procedure. This is a courtesy call and so if there is no answer  at the home number and we have not heard from you through the emergency physician on call, we will assume that you have returned to your regular daily activities without incident.  SIGNATURES/CONFIDENTIALITY: You and/or your care partner have signed paperwork which will be entered into your electronic medical record.  These signatures attest to the fact that that the information above on your After Visit Summary has been reviewed and is understood.  Full responsibility of the confidentiality of this discharge  information lies with you and/or your care-partner.

## 2014-04-29 NOTE — Progress Notes (Signed)
A/ox3, pleased with MAC, report to RN 

## 2014-04-30 ENCOUNTER — Telehealth: Payer: Self-pay | Admitting: *Deleted

## 2014-04-30 NOTE — Telephone Encounter (Signed)
  Follow up Call-  Call back number 04/29/2014  Post procedure Call Back phone  # [phone]  Permission to leave phone message Yes     Patient questions:  Wrong # given according to lady who answered.

## 2015-02-17 ENCOUNTER — Other Ambulatory Visit (INDEPENDENT_AMBULATORY_CARE_PROVIDER_SITE_OTHER): Payer: 59

## 2015-02-17 DIAGNOSIS — Z0000 Encounter for general adult medical examination without abnormal findings: Secondary | ICD-10-CM

## 2015-02-17 LAB — CBC WITH DIFFERENTIAL/PLATELET
Basophils Absolute: 0 10*3/uL (ref 0.0–0.1)
Basophils Relative: 0.4 % (ref 0.0–3.0)
Eosinophils Absolute: 0.1 10*3/uL (ref 0.0–0.7)
Eosinophils Relative: 2.4 % (ref 0.0–5.0)
HEMATOCRIT: 41.5 % (ref 39.0–52.0)
HEMOGLOBIN: 14 g/dL (ref 13.0–17.0)
Lymphocytes Relative: 55.3 % — ABNORMAL HIGH (ref 12.0–46.0)
Lymphs Abs: 1.9 10*3/uL (ref 0.7–4.0)
MCHC: 33.6 g/dL (ref 30.0–36.0)
MCV: 87.3 fl (ref 78.0–100.0)
MONO ABS: 0.3 10*3/uL (ref 0.1–1.0)
Monocytes Relative: 8.3 % (ref 3.0–12.0)
NEUTROS ABS: 1.2 10*3/uL — AB (ref 1.4–7.7)
Neutrophils Relative %: 33.6 % — ABNORMAL LOW (ref 43.0–77.0)
Platelets: 166 10*3/uL (ref 150.0–400.0)
RBC: 4.76 Mil/uL (ref 4.22–5.81)
RDW: 14.3 % (ref 11.5–15.5)
WBC: 3.4 10*3/uL — ABNORMAL LOW (ref 4.0–10.5)

## 2015-02-17 LAB — POCT URINALYSIS DIPSTICK
Bilirubin, UA: NEGATIVE
Blood, UA: NEGATIVE
GLUCOSE UA: NEGATIVE
KETONES UA: NEGATIVE
Leukocytes, UA: NEGATIVE
NITRITE UA: NEGATIVE
Protein, UA: NEGATIVE
Spec Grav, UA: <=1.005
Urobilinogen, UA: 0.2
pH, UA: 5.5

## 2015-02-17 LAB — TSH: TSH: 2.12 u[IU]/mL (ref 0.35–4.50)

## 2015-02-17 LAB — BASIC METABOLIC PANEL
BUN: 8 mg/dL (ref 6–23)
CALCIUM: 9.3 mg/dL (ref 8.4–10.5)
CO2: 29 mEq/L (ref 19–32)
CREATININE: 1.4 mg/dL (ref 0.40–1.50)
Chloride: 106 mEq/L (ref 96–112)
GFR: 70.05 mL/min (ref >60.00)
Glucose, Bld: 92 mg/dL (ref 70–99)
Potassium: 4.5 mEq/L (ref 3.5–5.1)
Sodium: 137 mEq/L (ref 135–145)

## 2015-02-17 LAB — HEPATIC FUNCTION PANEL
ALBUMIN: 4.3 g/dL (ref 3.5–5.2)
ALT: 16 U/L (ref 0–53)
AST: 25 U/L (ref 0–37)
Alkaline Phosphatase: 45 U/L (ref 39–117)
BILIRUBIN TOTAL: 1 mg/dL (ref 0.2–1.2)
Bilirubin, Direct: 0.2 mg/dL (ref 0.0–0.3)
TOTAL PROTEIN: 6.9 g/dL (ref 6.0–8.3)

## 2015-02-17 LAB — LIPID PANEL
Cholesterol: 139 mg/dL (ref 0–200)
HDL: 43.5 mg/dL (ref >39.00)
LDL Cholesterol: 85 mg/dL (ref 0–99)
NonHDL: 95.5
Total CHOL/HDL Ratio: 3
Triglycerides: 53 mg/dL (ref 0.0–149.0)
VLDL: 10.6 mg/dL (ref 0.0–40.0)

## 2015-02-24 ENCOUNTER — Ambulatory Visit (INDEPENDENT_AMBULATORY_CARE_PROVIDER_SITE_OTHER): Payer: 59 | Admitting: Adult Health

## 2015-02-24 ENCOUNTER — Encounter: Payer: Self-pay | Admitting: Internal Medicine

## 2015-02-24 ENCOUNTER — Encounter: Payer: Self-pay | Admitting: Adult Health

## 2015-02-24 VITALS — BP 118/80 | Temp 97.9°F | Ht 70.5 in | Wt 159.9 lb

## 2015-02-24 DIAGNOSIS — Z0000 Encounter for general adult medical examination without abnormal findings: Secondary | ICD-10-CM

## 2015-02-24 DIAGNOSIS — N508 Other specified disorders of male genital organs: Secondary | ICD-10-CM

## 2015-02-24 DIAGNOSIS — Z0001 Encounter for general adult medical examination with abnormal findings: Secondary | ICD-10-CM | POA: Diagnosis not present

## 2015-02-24 DIAGNOSIS — N529 Male erectile dysfunction, unspecified: Secondary | ICD-10-CM

## 2015-02-24 DIAGNOSIS — N50819 Testicular pain, unspecified: Secondary | ICD-10-CM | POA: Insufficient documentation

## 2015-02-24 DIAGNOSIS — N528 Other male erectile dysfunction: Secondary | ICD-10-CM

## 2015-02-24 DIAGNOSIS — R6889 Other general symptoms and signs: Secondary | ICD-10-CM

## 2015-02-24 NOTE — Assessment & Plan Note (Signed)
Sample of Cialis given and patient educated on instructions of use.

## 2015-02-24 NOTE — Assessment & Plan Note (Signed)
Reviewed health maintenance protocols. His height, weight and blood pressure are normal. Screening labs are normal except that his WBC continues to be low and Lymphocytes are high.

## 2015-02-24 NOTE — Patient Instructions (Addendum)
It was great meeting you today. Like we talked about, I will contact Dr. Shawna Orleans about the white blood cell count.  Work on [employer] and start exercising! Continue to stay well hydrated. Follow up in 1 year for a complete physical.   For the Cialis If you are going to take it every day take one tab of the sample every day. If you are going to use it as needed you can take 2-4 pills, 30 minutes before sex. Discontinue if you have a headache or feel dizzy. Report to the ER with an erection longer than four hours.   Health Maintenance A healthy lifestyle and preventative care can promote health and wellness.  Maintain regular health, dental, and eye exams.  Eat a healthy diet. Foods like vegetables, fruits, whole grains, low-fat dairy products, and lean protein foods contain the nutrients you need and are low in calories. Decrease your intake of foods high in solid fats, added sugars, and salt. Get information about a proper diet from your health care provider, if necessary.  Regular physical exercise is one of the most important things you can do for your health. Most adults should get at least 150 minutes of moderate-intensity exercise (any activity that increases your heart rate and causes you to sweat) each week. In addition, most adults need muscle-strengthening exercises on 2 or more days a week.   Maintain a healthy weight. The body mass index (BMI) is a screening tool to identify possible weight problems. It provides an estimate of body fat based on height and weight. Your health care provider can find your BMI and can help you achieve or maintain a healthy weight. For males 20 years and older:  A BMI below 18.5 is considered underweight.  A BMI of 18.5 to 24.9 is normal.  A BMI of 25 to 29.9 is considered overweight.  A BMI of 30 and above is considered obese.  Maintain normal blood lipids and cholesterol by exercising and minimizing your intake of saturated fat. Eat a balanced diet with  plenty of fruits and vegetables. Blood tests for lipids and cholesterol should begin at age 64 and be repeated every 5 years. If your lipid or cholesterol levels are high, you are over age 38, or you are at high risk for heart disease, you may need your cholesterol levels checked more frequently.Ongoing high lipid and cholesterol levels should be treated with medicines if diet and exercise are not working.  If you smoke, find out from your health care provider how to quit. If you do not use tobacco, do not start.  Lung cancer screening is recommended for adults aged 8-80 years who are at high risk for developing lung cancer because of a history of smoking. A yearly low-dose CT scan of the lungs is recommended for people who have at least a 30-pack-year history of smoking and are current smokers or have quit within the past 15 years. A pack year of smoking is smoking an average of 1 pack of cigarettes a day for 1 year (for example, a 30-pack-year history of smoking could mean smoking 1 pack a day for 30 years or 2 packs a day for 15 years). Yearly screening should continue until the smoker has stopped smoking for at least 15 years. Yearly screening should be stopped for people who develop a health problem that would prevent them from having lung cancer treatment.  If you choose to drink alcohol, do not have more than 2 drinks per day. One  drink is considered to be 12 oz (360 mL) of beer, 5 oz (150 mL) of wine, or 1.5 oz (45 mL) of liquor.  Avoid the use of street drugs. Do not share needles with anyone. Ask for help if you need support or instructions about stopping the use of drugs.  High blood pressure causes heart disease and increases the risk of stroke. Blood pressure should be checked at least every 1-2 years. Ongoing high blood pressure should be treated with medicines if weight loss and exercise are not effective.  If you are 38-54 years old, ask your health care provider if you should take  aspirin to prevent heart disease.  Diabetes screening involves taking a blood sample to check your fasting blood sugar level. This should be done once every 3 years after age 13 if you are at a normal weight and without risk factors for diabetes. Testing should be considered at a younger age or be carried out more frequently if you are overweight and have at least 1 risk factor for diabetes.  Colorectal cancer can be detected and often prevented. Most routine colorectal cancer screening begins at the age of 66 and continues through age 44. However, your health care provider may recommend screening at an earlier age if you have risk factors for colon cancer. On a yearly basis, your health care provider may provide home test kits to check for hidden blood in the stool. A small camera at the end of a tube may be used to directly examine the colon (sigmoidoscopy or colonoscopy) to detect the earliest forms of colorectal cancer. Talk to your health care provider about this at age 12 when routine screening begins. A direct exam of the colon should be repeated every 5-10 years through age 44, unless early forms of precancerous polyps or small growths are found.  People who are at an increased risk for hepatitis B should be screened for this virus. You are considered at high risk for hepatitis B if:  You were born in a country where hepatitis B occurs often. Talk with your health care provider about which countries are considered high risk.  Your parents were born in a high-risk country and you have not received a shot to protect against hepatitis B (hepatitis B vaccine).  You have HIV or AIDS.  You use needles to inject street drugs.  You live with, or have sex with, someone who has hepatitis B.  You are a man who has sex with other men (MSM).  You get hemodialysis treatment.  You take certain medicines for conditions like cancer, organ transplantation, and autoimmune conditions.  Hepatitis C blood  testing is recommended for all people born from [birth year] through 1965 and any individual with known risk factors for hepatitis C.  Healthy men should no longer receive prostate-specific antigen (PSA) blood tests as part of routine cancer screening. Talk to your health care provider about prostate cancer screening.  Testicular cancer screening is not recommended for adolescents or adult males who have no symptoms. Screening includes self-exam, a health care provider exam, and other screening tests. Consult with your health care provider about any symptoms you have or any concerns you have about testicular cancer.  Practice safe sex. Use condoms and avoid high-risk sexual practices to reduce the spread of sexually transmitted infections (STIs).  You should be screened for STIs, including gonorrhea and chlamydia if:  You are sexually active and are younger than 24 years.  You are older than 24 years,  and your health care provider tells you that you are at risk for this type of infection.  Your sexual activity has changed since you were last screened, and you are at an increased risk for chlamydia or gonorrhea. Ask your health care provider if you are at risk.  If you are at risk of being infected with HIV, it is recommended that you take a prescription medicine daily to prevent HIV infection. This is called pre-exposure prophylaxis (PrEP). You are considered at risk if:  You are a man who has sex with other men (MSM).  You are a heterosexual man who is sexually active with multiple partners.  You take drugs by injection.  You are sexually active with a partner who has HIV.  Talk with your health care provider about whether you are at high risk of being infected with HIV. If you choose to begin PrEP, you should first be tested for HIV. You should then be tested every 3 months for as long as you are taking PrEP.  Use sunscreen. Apply sunscreen liberally and repeatedly throughout the day. You  should seek shade when your shadow is shorter than you. Protect yourself by wearing long sleeves, pants, a wide-brimmed hat, and sunglasses year round whenever you are outdoors.  Tell your health care provider of new moles or changes in moles, especially if there is a change in shape or color. Also, tell your health care provider if a mole is larger than the size of a pencil eraser.  A one-time screening for abdominal aortic aneurysm (AAA) and surgical repair of large AAAs by ultrasound is recommended for men aged 49-75 years who are current or former smokers.  Stay current with your vaccines (immunizations). Document Released: 04/06/2008 Document Revised: 10/14/2013 Document Reviewed: 03/06/2011 [facility] Patient Information 2015 [place], [place]. This information is not intended to replace advice given to you by your health care provider. Make sure you discuss any questions you have with your health care provider.

## 2015-02-24 NOTE — Progress Notes (Signed)
Subjective:     Patient ID: Bruce [name], male   DOB: 08-13-1966, 49 y.o.   MRN: [id number]  HPI  HPI  49 year old [place] American male for routine physical. Patient denies any significant interval medical history. He does not have any chronic illnesses. He takes no prescription medications.  Has no complaints except that he feels as though he erections are "getting worse".  He is having premature ejaculation and he is no longer having erections in the morning.Endorses having sex once every three weeks. He continues to have the desire but after work he and his wife are too tired to engage. He does endorse being able to get an erection during sex, he just feels as though     There is not constantly thinking about it . Able to get rrection during sex, it just does not last long after. He feels as though his penis is not as firm as it once was.   He is also complaining of intermittent genital discomfort and one genital wart that he would like burned off.   Social and family history reviewed and updated.       Review of Systems  Genitourinary: Positive for testicular pain. Negative for frequency, flank pain, discharge, difficulty urinating and penile pain.  Musculoskeletal: Positive for arthralgias (right shoulder and bilateral knee pain). Negative for myalgias and joint swelling.  All other systems reviewed and are negative.      Objective:   Physical Exam  Constitutional: He is oriented to person, place, and time. He appears well-developed and well-nourished. No distress.  HENT:  Head: Normocephalic and atraumatic.  Right Ear: External ear normal.  Left Ear: External ear normal.  Mouth/Throat: No oropharyngeal exudate.  Eyes: Conjunctivae and EOM are normal. Pupils are equal, round, and reactive to light. Right eye exhibits no discharge. Left eye exhibits no discharge.  Neck: No JVD present. No tracheal deviation present. No thyromegaly present.  Cardiovascular: Normal  rate, regular rhythm, normal heart sounds and intact distal pulses.  Exam reveals no gallop and no friction rub.   No murmur heard. Pulmonary/Chest: Effort normal and breath sounds normal. No stridor. No respiratory distress. He has no wheezes. He has no rales. He exhibits no tenderness.  Abdominal: Soft. Bowel sounds are normal. He exhibits no distension and no mass. There is no tenderness. There is no rebound and no guarding.  Musculoskeletal: Normal range of motion. He exhibits no edema or tenderness.  Lymphadenopathy:    He has no cervical adenopathy.  Neurological: He is alert and oriented to person, place, and time.  Skin: Skin is warm and dry. No rash noted. No erythema. No pallor.  Psychiatric: He has a normal mood and affect. His behavior is normal. Judgment and thought content normal.       Assessment:         Plan:

## 2015-03-01 ENCOUNTER — Encounter: Payer: Self-pay | Admitting: *Deleted

## 2015-03-31 ENCOUNTER — Ambulatory Visit: Payer: 59 | Admitting: Adult Health

## 2015-03-31 ENCOUNTER — Encounter: Payer: Self-pay | Admitting: Adult Health

## 2015-03-31 ENCOUNTER — Other Ambulatory Visit (INDEPENDENT_AMBULATORY_CARE_PROVIDER_SITE_OTHER): Payer: 59

## 2015-03-31 ENCOUNTER — Ambulatory Visit: Payer: 59 | Admitting: Internal Medicine

## 2015-03-31 ENCOUNTER — Ambulatory Visit (INDEPENDENT_AMBULATORY_CARE_PROVIDER_SITE_OTHER): Payer: 59 | Admitting: Adult Health

## 2015-03-31 VITALS — BP 102/70 | Temp 97.9°F | Ht 70.5 in | Wt 156.1 lb

## 2015-03-31 DIAGNOSIS — N289 Disorder of kidney and ureter, unspecified: Secondary | ICD-10-CM | POA: Diagnosis not present

## 2015-03-31 DIAGNOSIS — D729 Disorder of white blood cells, unspecified: Secondary | ICD-10-CM

## 2015-03-31 DIAGNOSIS — I10 Essential (primary) hypertension: Secondary | ICD-10-CM

## 2015-03-31 DIAGNOSIS — D235 Other benign neoplasm of skin of trunk: Secondary | ICD-10-CM

## 2015-03-31 DIAGNOSIS — D72829 Elevated white blood cell count, unspecified: Secondary | ICD-10-CM

## 2015-03-31 DIAGNOSIS — D225 Melanocytic nevi of trunk: Secondary | ICD-10-CM

## 2015-03-31 LAB — CBC WITH DIFFERENTIAL/PLATELET
Basophils Absolute: 0 10*3/uL (ref 0.0–0.1)
Basophils Relative: 0.5 % (ref 0.0–3.0)
Eosinophils Absolute: 0.1 10*3/uL (ref 0.0–0.7)
Eosinophils Relative: 1.5 % (ref 0.0–5.0)
HCT: 45.4 % (ref 39.0–52.0)
Hemoglobin: 15.3 g/dL (ref 13.0–17.0)
Lymphocytes Relative: 54.8 % — ABNORMAL HIGH (ref 12.0–46.0)
Lymphs Abs: 2.4 10*3/uL (ref 0.7–4.0)
MCHC: 33.7 g/dL (ref 30.0–36.0)
MCV: 88.4 fl (ref 78.0–100.0)
Monocytes Absolute: 0.4 10*3/uL (ref 0.1–1.0)
Monocytes Relative: 8.7 % (ref 3.0–12.0)
Neutro Abs: 1.5 10*3/uL (ref 1.4–7.7)
Neutrophils Relative %: 34.5 % — ABNORMAL LOW (ref 43.0–77.0)
Platelets: 161 10*3/uL (ref 150.0–400.0)
RBC: 5.14 Mil/uL (ref 4.22–5.81)
RDW: 14.3 % (ref 11.5–15.5)
WBC: 4.3 10*3/uL (ref 4.0–10.5)

## 2015-03-31 LAB — BASIC METABOLIC PANEL
BUN: 12 mg/dL (ref 6–23)
CO2: 26 mEq/L (ref 19–32)
Calcium: 9.6 mg/dL (ref 8.4–10.5)
Chloride: 104 mEq/L (ref 96–112)
Creatinine, Ser: 1.02 mg/dL (ref 0.40–1.50)
GFR: 100.9 mL/min (ref >60.00)
Glucose, Bld: 89 mg/dL (ref 70–99)
Potassium: 4.3 mEq/L (ref 3.5–5.1)
Sodium: 136 mEq/L (ref 135–145)

## 2015-03-31 NOTE — Patient Instructions (Signed)
I will follow up with you regarding your labs.   I will put a consult in with Hematology for your low white blood cell count.   Monitor your groin for signs and symptoms of infection. If you notice any please follow up with me. Keep it clean and dry.

## 2015-03-31 NOTE — Progress Notes (Signed)
Pre visit review using our clinic review tool, if applicable. No additional management support is needed unless otherwise documented below in the visit note. 

## 2015-03-31 NOTE — Progress Notes (Signed)
   Subjective:    Patient ID: Bruce [name], male    DOB: 02-Jun-1966, 49 y.o.   MRN: [id number]  HPI  Patient presents to the office today to follow up on his Kidney Function Panel and Leukocystosis.   He would also like a Nevous in his left groin removed. Denies any changes in appearance or size. He would like it removed for dermatological reasons.    Review of Systems  Constitutional: Negative.   HENT: Negative.   Eyes: Negative.   Respiratory: Negative.   Cardiovascular: Negative.   Gastrointestinal: Negative.   Endocrine: Negative.   Genitourinary: Negative.   Musculoskeletal: Negative.   Skin:       Nevous on left groin   All other systems reviewed and are negative.      Objective:   Physical Exam  Constitutional: He is oriented to person, place, and time. He appears well-developed and well-nourished. No distress.  Cardiovascular: Normal rate, regular rhythm, normal heart sounds and intact distal pulses.  Exam reveals no gallop and no friction rub.   No murmur heard. Pulmonary/Chest: Effort normal and breath sounds normal. No respiratory distress. He has no wheezes. He has no rales. He exhibits no tenderness.  Genitourinary: Penis normal. No penile tenderness.  Neurological: He is alert and oriented to person, place, and time.  Skin: Skin is warm and dry. He is not diaphoretic.  Has 74mm x 86mm keratosis/nevous on left groin  Psychiatric: He has a normal mood and affect. His behavior is normal. Judgment and thought content normal.  Nursing note and vitals reviewed.      Assessment & Plan:  1. Abnormal WBC count - CBC with Differential - Will follow up regarding lab results   2. Nevus of groin Procedure including risks/benefits explained to patient.  Questions were answered. After informed consent was obtained and a time out completed, site was cleansed with betadine and then alcohol. 1% Lidocaine with epinephrine was injected under lesion and then shave biopsy  was performed. Area was cauterized with liquid nitrogen spray to obtain hemostasis.  Pt tolerated procedure well.  Specimen sent for pathology review.  Pt instructed to keep the area dry for 24 hours and to contact us if he develops redness, drainage or swelling at the site.  Pt may use tylenol as needed for discomfort today.  - Dermatology pathology  3. Function kidney decreased BMP - Will follow up with results.

## 2015-03-31 NOTE — Addendum Note (Signed)
Addended by: Apolinar Junes on: 03/31/2015 04:51 PM   Modules accepted: Level of Service

## 2015-04-01 NOTE — Progress Notes (Signed)
Called and spoke with pt and pt is aware.  Pt states he is feeling good.

## 2015-08-04 ENCOUNTER — Telehealth: Payer: Self-pay | Admitting: Hematology

## 2015-08-04 NOTE — Telephone Encounter (Signed)
New patient appt-s/w patient and gave np appt for 11/02 @ 10:30 w/Dr. Irene Limbo.  Dr. Dorothyann Peng Dx-abnormal wbc ct, leukocytosis

## 2015-08-25 ENCOUNTER — Ambulatory Visit (HOSPITAL_BASED_OUTPATIENT_CLINIC_OR_DEPARTMENT_OTHER): Payer: 59 | Admitting: Hematology

## 2015-08-25 ENCOUNTER — Telehealth: Payer: Self-pay | Admitting: Hematology

## 2015-08-25 ENCOUNTER — Encounter: Payer: Self-pay | Admitting: Hematology

## 2015-08-25 ENCOUNTER — Other Ambulatory Visit (HOSPITAL_BASED_OUTPATIENT_CLINIC_OR_DEPARTMENT_OTHER): Payer: 59

## 2015-08-25 VITALS — BP 115/81 | HR 57 | Temp 98.0°F | Resp 18 | Ht 70.5 in | Wt 160.2 lb

## 2015-08-25 DIAGNOSIS — D708 Other neutropenia: Secondary | ICD-10-CM | POA: Diagnosis not present

## 2015-08-25 DIAGNOSIS — R5383 Other fatigue: Secondary | ICD-10-CM

## 2015-08-25 LAB — COMPREHENSIVE METABOLIC PANEL (CC13)
ALT: 15 U/L (ref 0–55)
AST: 22 U/L (ref 5–34)
Albumin: 4.2 g/dL (ref 3.5–5.0)
Alkaline Phosphatase: 52 U/L (ref 40–150)
Anion Gap: 5 mEq/L (ref 3–11)
BILIRUBIN TOTAL: 1.19 mg/dL (ref 0.20–1.20)
BUN: 14.1 mg/dL (ref 7.0–26.0)
CHLORIDE: 109 meq/L (ref 98–109)
CO2: 25 meq/L (ref 22–29)
CREATININE: 0.9 mg/dL (ref 0.7–1.3)
Calcium: 9.6 mg/dL (ref 8.4–10.4)
EGFR: >90 mL/min/{1.73_m2} (ref >90)
GLUCOSE: 98 mg/dL (ref 70–140)
Potassium: 4.3 mEq/L (ref 3.5–5.1)
SODIUM: 139 meq/L (ref 136–145)
TOTAL PROTEIN: 7.2 g/dL (ref 6.4–8.3)

## 2015-08-25 LAB — CBC & DIFF AND RETIC
BASO%: 0.3 % (ref 0.0–2.0)
Basophils Absolute: 0 10*3/uL (ref 0.0–0.1)
EOS%: 1.9 % (ref 0.0–7.0)
Eosinophils Absolute: 0.1 10*3/uL (ref 0.0–0.5)
HCT: 41.6 % (ref 38.4–49.9)
HGB: 14.4 g/dL (ref 13.0–17.1)
IMMATURE RETIC FRACT: 3.1 % (ref 3.00–10.60)
LYMPH#: 1.8 10*3/uL (ref 0.9–3.3)
LYMPH%: 48.8 % (ref 14.0–49.0)
MCH: 30 pg (ref 27.2–33.4)
MCHC: 34.6 g/dL (ref 32.0–36.0)
MCV: 86.7 fL (ref 79.3–98.0)
MONO#: 0.3 10*3/uL (ref 0.1–0.9)
MONO%: 9.4 % (ref 0.0–14.0)
NEUT%: 39.6 % (ref 39.0–75.0)
NEUTROS ABS: 1.4 10*3/uL — AB (ref 1.5–6.5)
Platelets: 147 10*3/uL (ref 140–400)
RBC: 4.8 10*6/uL (ref 4.20–5.82)
RDW: 13.2 % (ref 11.0–14.6)
RETIC %: 0.77 % — AB (ref 0.80–1.80)
RETIC CT ABS: 36.96 10*3/uL (ref 34.80–93.90)
WBC: 3.6 10*3/uL — AB (ref 4.0–10.3)

## 2015-08-25 LAB — TSH CHCC: TSH: 2.349 m(IU)/L (ref 0.320–4.118)

## 2015-08-25 LAB — LACTATE DEHYDROGENASE (CC13): LDH: 204 U/L (ref 125–245)

## 2015-08-25 LAB — CHCC SMEAR

## 2015-08-25 NOTE — Telephone Encounter (Signed)
Gave and printed appt sched and avs fo rpt; for NOV  °

## 2015-08-25 NOTE — Progress Notes (Signed)
[facility]    HEMATOLOGY/ONCOLOGY CONSULTATION NOTE  Date of Service: 08/25/2015  Patient Care Team: Doe-Hyun Kyra Searles, DO as PCP - General  CHIEF COMPLAINTS/PURPOSE OF CONSULTATION:   leukopenia HISTORY OF PRESENTING ILLNESS:  Bruce [name] is a wonderful 49 y.o. 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  Patient has a history of erectile dysfunction on Cialis, previous gonorrhea/chlamydia infections, previous alcohol and polysubstance abuse sober for 20 years, allergic rhinitis.  He has noted persistent fatigue for several years which has not been worsening. 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 He notes that he is very busy with his job and family and does not have much time to relax. Notes some night sweats but not much. Has some issues with chronic constipation and left-sided abdominal pain which resolves with bowel movement. No fevers or chills. No weight loss. He has had no issues with frequent infections.  He does note persistent sinusitis which sounds allergic. He notes that he has been using elderberry juice. Has had some relief with Flonase previously but has not been using it since it apparently gives him a sore throat.  Review of his labs show that his WBC counts have been somewhat low in the 3000 range from at least 2011 with mild neutropenia. He reports no issues with recurrent infections that would suggest an immunodeficiency state.  He has some family history of lupus but does not himself have any issues with skin rashes, mouth sores, joint pains or swellings or other overt stigmata of autoimmune disease.  Denies recent use of cocaine. Denies any other significant medications.  MEDICAL HISTORY:  Past Medical History  Diagnosis Date  . Impotence of organic origin   . Substance abuse 1995  . Erectile dysfunction   . STD (sexually transmitted disease)    Previous history of gonorrhea, chlamydia and genital warts    SURGICAL HISTORY: Past Surgical History  Procedure Laterality Date  . Laceration of head  1991  . Vasectomy    . Hemorrhoid      Patient reports he probably had sclerosis done    SOCIAL HISTORY: Social History   Social History  . Marital Status: Married    Spouse Name: Nocita  . Number of Children: 7  . Years of Education: N/A   Occupational History  . Sanitation Worker     [employer] [place] History Main Topics  . Smoking status: Former Smoker -- 0.50 packs/day    Quit date: 08/24/1994  . Smokeless tobacco: Never Used     Comment: Ex-smoker quit 20 years ago  . Alcohol Use: Yes     Comment: rarely/glass of wine. 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 Drug Use: No     Comment: previous h/o marijuana and cocaine use -sober 20 years    . Sexual Activity: Yes   Other Topics Concern  . Not on file   Social History Narrative   5 daughters, 2 sons   Quit smoking over 47 years ago          FAMILY HISTORY: Family History  Problem Relation Age of Onset  . Kidney disease Father     due to drug use  . Coronary artery disease Maternal Grandfather   . Hypertension Other   . Cancer Other     Uncle-Leukemia  . Lupus  Other     Aunt    ALLERGIES:  has No Known Allergies.  MEDICATIONS:  Current Outpatient Prescriptions  Medication Sig Dispense Refill  . tadalafil (CIALIS) 10 MG tablet Take 10 mg by mouth daily as needed for erectile dysfunction.     No current facility-administered medications for this visit.    REVIEW OF SYSTEMS:    10 Point review of Systems was done is negative except as noted above.  PHYSICAL EXAMINATION: ECOG PERFORMANCE STATUS: 1 - Symptomatic but completely ambulatory  . Filed Vitals:   08/25/15 1054  BP: 115/81  Pulse: 57  Temp: 98 F (36.7 C)  Resp: 18   Filed Weights   08/25/15 1054  Weight: 160 lb 3.2 oz (72.666 kg)   .Body mass index  is 22.65 kg/(m^2).  GENERAL:alert, in no acute distress and comfortable SKIN: skin color, texture, turgor are normal, no rashes or significant lesions EYES: normal, conjunctiva are pink and non-injected, sclera clear OROPHARYNX:no exudate, no erythema and lips, buccal mucosa, and tongue normal  NECK: supple, no JVD, thyroid normal size, non-tender, without nodularity LYMPH:  no palpable lymphadenopathy in the cervical, axillary or inguinal LUNGS: clear to auscultation with normal respiratory effort HEART: regular rate & rhythm,  no murmurs and no lower extremity edema ABDOMEN: abdomen soft, non-tender, normoactive bowel sounds . No hepatosplenomegaly Musculoskeletal: no cyanosis of digits and no clubbing  PSYCH: alert & oriented x 3 with fluent speech. NEURO: no focal motor/sensory deficits  LABORATORY DATA:  I have reviewed the data as listed  . CBC Latest Ref Rng 08/25/2015 03/31/2015 02/17/2015  WBC 4.0 - 10.3 10e3/uL 3.6(L) 4.3 3.4(L)  Hemoglobin 13.0 - 17.1 g/dL 14.4 15.3 14.0  Hematocrit 38.4 - 49.9 % 41.6 45.4 41.5  Platelets 140 - 400 10e3/uL 147 161.0 166.0   . CBC    Component Value Date/Time   WBC 3.6* 08/25/2015 1208   WBC 4.3 03/31/2015 0957   RBC 4.80 08/25/2015 1208   RBC 5.14 03/31/2015 0957   HGB 14.4 08/25/2015 1208   HGB 15.3 03/31/2015 0957   HCT 41.6 08/25/2015 1208   HCT 45.4 03/31/2015 0957   PLT 147 08/25/2015 1208   PLT 161.0 03/31/2015 0957   MCV 86.7 08/25/2015 1208   MCV 88.4 03/31/2015 0957   MCH 30.0 08/25/2015 1208   MCHC 34.6 08/25/2015 1208   MCHC 33.7 03/31/2015 0957   RDW 13.2 08/25/2015 1208   RDW 14.3 03/31/2015 0957   LYMPHSABS 1.8 08/25/2015 1208   LYMPHSABS 2.4 03/31/2015 0957   MONOABS 0.3 08/25/2015 1208   MONOABS 0.4 03/31/2015 0957   EOSABS 0.1 08/25/2015 1208   EOSABS 0.1 03/31/2015 0957   BASOSABS 0.0 08/25/2015 1208   BASOSABS 0.0 03/31/2015 0957     . CMP Latest Ref Rng 08/25/2015 03/31/2015 02/17/2015  Glucose 70 -  140 mg/dl 98 89 92  BUN 7.0 - 26.0 mg/dL 14.1 12 8   Creatinine 0.7 - 1.3 mg/dL 0.9 1.02 1.40  Sodium 136 - 145 mEq/L 139 136 137  Potassium 3.5 - 5.1 mEq/L 4.3 4.3 4.5  Chloride 96 - 112 mEq/L - 104 106  CO2 22 - 29 mEq/L 25 26 29   Calcium 8.4 - 10.4 mg/dL 9.6 9.6 9.3  Total Protein 6.4 - 8.3 g/dL 7.2 - 6.9  Total Bilirubin 0.20 - 1.20 mg/dL 1.19 - 1.0  Alkaline Phos 40 - 150 U/L 52 - 45  AST 5 - 34 U/L 22 - 25  ALT 0 - 55 U/L 15 -  16      Peripheral Blood Smear:  Normocytic red cells. No increased schistocytes. No increased microspherocytes. Few pencils cells. Mildly decreased neutrophils. Normal-appearing lymphocytes. No evidence of increased blasts. No evidence of increased LGL's. Adequate platelets with no platelet clumping.    RADIOGRAPHIC STUDIES: I have personally reviewed the radiological images as listed and agreed with the findings in the report. No results found.  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 His WBC count has been in the 3000 range with neutrophil counts around the mid thousands no issues with frequent infections. No hepatosplenomegaly. No fevers or chills. No weight loss. No other significant cytopenias. Peripheral blood smear is not revealing of any overt WBC abnormalities. 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 Plan -Given the patient's previous history of STDs and polysubstance abuse we will get an HIV test, hepatitis C. -We'll get a sedimentation rate CRP to rule out the inflammatory condition that might trigger additional autoimmune workup. -LDH -TSH -Patient reports that he had a TB test done a couple of years ago which was negative. Does not have any specific respiratory symptoms. No fevers or chills. No weight loss. -No indication for G-CSF at  this time.  #2 Erectile dysfunction on Cialis-management per primary care physician. #3 chronic constipation-encourage adequate water intake and increased fiber in the diet.  Return to care with Dr. Irene Limbo in 2 weeks to follow up on his lab results    All of the patients questions were answered with apparent satisfaction. The patient knows to call the clinic with any problems, questions or concerns.  I spent 45 minutes counseling the patient face to face. The total time spent in the appointment was 60 minutes and more than 50% was on counseling and direct patient cares.    Sullivan Lone MD [place] AAHIVMS [facility] Hematology/Oncology Physician [facility]  (Office):       [phone] (Work cell):  [phone] (Fax):           [phone]  08/25/2015 11:13 AM

## 2015-08-26 LAB — C-REACTIVE PROTEIN

## 2015-08-26 LAB — HEPATITIS C ANTIBODY: HCV Ab: NEGATIVE

## 2015-08-26 LAB — SEDIMENTATION RATE: Sed Rate: 4 mm/hr (ref 0–15)

## 2015-08-26 LAB — HIV ANTIBODY (ROUTINE TESTING W REFLEX): HIV: NONREACTIVE

## 2015-09-08 ENCOUNTER — Encounter: Payer: Self-pay | Admitting: Hematology

## 2015-09-08 ENCOUNTER — Ambulatory Visit (HOSPITAL_BASED_OUTPATIENT_CLINIC_OR_DEPARTMENT_OTHER): Payer: 59 | Admitting: Hematology

## 2015-09-08 VITALS — BP 109/66 | HR 62 | Temp 97.6°F | Resp 16 | Ht 70.0 in | Wt 167.0 lb

## 2015-09-08 DIAGNOSIS — D72819 Decreased white blood cell count, unspecified: Secondary | ICD-10-CM

## 2015-09-08 DIAGNOSIS — D709 Neutropenia, unspecified: Secondary | ICD-10-CM

## 2015-09-08 NOTE — Progress Notes (Signed)
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 Bruce [name] is here for follow-up of his lab tests done for workup of his fatigue and chronic leukopenia/neutropenia. He notes he is feeling well with no new concerns. We discussed his lab results in detail which do not appear to reveal an alternative etiology for his chronic convenience/neutropenia. He notes no infections since his last visit. No abdominal pain. No fevers or chills. No night sweats. Eating well. We discussed if he is feeling depressed. He noted that he did not feel this was a significant concern and other than some rare Pity parties he has been doing well.  REVIEW OF SYSTEMS:    10 Point review of systems of done and is negative except as noted above.  . Past Medical History  Diagnosis Date  . Impotence of organic origin   . Substance abuse 1995  . Erectile dysfunction   . STD (sexually transmitted disease)     Previous history of gonorrhea, chlamydia and genital warts    . Past Surgical History  Procedure Laterality Date  . Laceration of head  1991  . Vasectomy    . Hemorrhoid      Patient reports he probably had sclerosis done    . Social History  Substance Use Topics  . Smoking status: Former Smoker -- 0.50 packs/day    Quit date: 08/24/1994  . Smokeless tobacco: Never Used     Comment: Ex-smoker quit 20 years ago  . Alcohol Use: Yes     Comment: rarely/glass of wine. 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  MEDICATIONS:  Current Outpatient Prescriptions  Medication Sig Dispense Refill  . tadalafil (CIALIS) 10 MG tablet Take 10 mg by mouth daily as needed for erectile dysfunction.     No current facility-administered medications for this visit.     PHYSICAL EXAMINATION: ECOG PERFORMANCE STATUS: 0 - Asymptomatic  . Filed Vitals:   09/08/15 1003  BP: 109/66  Pulse: 62  Temp: 97.6 F (36.4 C)  Resp: 16    Filed Weights   09/08/15 1003  Weight: 167 lb (75.751 kg)   .Body mass index is 23.96 kg/(m^2).  GENERAL:alert, in no acute distress and comfortable SKIN: skin color, texture, turgor are normal, no rashes or significant lesions EYES: normal, conjunctiva are pink and non-injected, sclera clear OROPHARYNX:no exudate, no erythema and lips, buccal mucosa, and tongue normal  NECK: supple, no JVD, thyroid normal size, non-tender, without nodularity LYMPH:  no palpable lymphadenopathy in the cervical, axillary or inguinal LUNGS: clear to auscultation with normal respiratory effort HEART: regular rate & rhythm,  no murmurs and no lower extremity edema ABDOMEN: abdomen soft, non-tender, normoactive bowel sounds  Musculoskeletal: no cyanosis of digits and no clubbing  PSYCH: alert & oriented x 3 with fluent speech NEURO: no focal motor/sensory deficits  LABORATORY DATA:   I have reviewed the data as listed  . CBC Latest Ref Rng 08/25/2015 03/31/2015 02/17/2015  WBC 4.0 - 10.3 10e3/uL 3.6(L) 4.3 3.4(L)  Hemoglobin 13.0 - 17.1 g/dL 14.4 15.3 14.0  Hematocrit 38.4 - 49.9 % 41.6 45.4 41.5  Platelets 140 - 400 10e3/uL 147 161.0 166.0    . CMP Latest Ref Rng 08/25/2015 03/31/2015 02/17/2015  Glucose 70 - 140 mg/dl 98  89 92  BUN 7.0 - 26.0 mg/dL 14.$RemoveBefor'1 12 8  'BRawXYWsUAXg$ Creatinine 0.7 - 1.3 mg/dL 0.9 1.02 1.40  Sodium 136 - 145 mEq/L 139 136 137  Potassium 3.5 - 5.1 mEq/L 4.3 4.3 4.5  Chloride 96 - 112 mEq/L - 104 106  CO2 22 - 29 mEq/L $Remove'25 26 29  'RXaPxuS$ Calcium 8.4 - 10.4 mg/dL 9.6 9.6 9.3  Total Protein 6.4 - 8.3 g/dL 7.2 - 6.9  Total Bilirubin 0.20 - 1.20 mg/dL 1.19 - 1.0  Alkaline Phos 40 - 150 U/L 52 - 45  AST 5 - 34 U/L 22 - 25  ALT 0 - 55 U/L 15 - 16   Component     Latest Ref Rng 08/25/2015  WBC     4.0 - 10.3 10e3/uL 3.6 (L)   NEUT#     1.5 - 6.5 10e3/uL 1.4 (L)  Hemoglobin     13.0 - 17.1 g/dL 14.4  HCT     38.4 - 49.9 % 41.6  Platelets     140 - 400 10e3/uL 147  MCV     79.3 - 98.0 fL 86.7  MCH     27.2 - 33.4 pg 30.0  MCHC     32.0 - 36.0 g/dL 34.6  RBC     4.20 - 5.82 10e6/uL 4.80  RDW     11.0 - 14.6 % 13.2  lymph#     0.9 - 3.3 10e3/uL 1.8  MONO#     0.1 - 0.9 10e3/uL 0.3  Eosinophils Absolute     0.0 - 0.5 10e3/uL 0.1  Basophils Absolute     0.0 - 0.1 10e3/uL 0.0  NEUT%     39.0 - 75.0 % 39.6  LYMPH%     14.0 - 49.0 % 48.8  MONO%     0.0 - 14.0 % 9.4  EOS%     0.0 - 7.0 % 1.9  BASO%     0.0 - 2.0 % 0.3  Retic %     0.80 - 1.80 % 0.77 (L)  Retic Ct Abs     34.80 - 93.90 10e3/uL 36.96  Immature Retic Fract     3.00 - 10.60 % 3.10  Sodium     136 - 145 mEq/L 139  Potassium     3.5 - 5.1 mEq/L 4.3  Chloride     98 - 109 mEq/L 109  CO2     22 - 29 mEq/L 25  Glucose     70 - 140 mg/dl 98  BUN     7.0 - 26.0 mg/dL 14.1  Creatinine     0.7 - 1.3 mg/dL 0.9  Total Bilirubin     0.20 - 1.20 mg/dL 1.19  Alkaline Phosphatase     40 - 150 U/L 52  AST     5 - 34 U/L 22  ALT     0 - 55 U/L 15  Total Protein     6.4 - 8.3 g/dL 7.2  Albumin     3.5 - 5.0 g/dL 4.2  Calcium     8.4 - 10.4 mg/dL 9.6  Anion gap     3 - 11 mEq/L 5  EGFR     >90 ml/min/1.73 m2 >90  TSH     0.320 - 4.118 m(IU)/L 2.349  Sed Rate     0 - 15 mm/hr 4  CRP     <0.60 mg/dL <0.5  HCV Ab     NEGATIVE NEGATIVE  HIV  NONREACTIVE NONREACTIVE  LDH     125 - 245 U/L 204    RADIOGRAPHIC STUDIES: I have personally reviewed the radiological images as listed and agreed with the findings in the report. No results found.  ASSESSMENT & PLAN:   49 year old African-American gentleman with  #1 chronic leukopenia/neutropenia -likely due to Benign Ethnic Neutropenia. Workup as noted above showed negative HIV test, negative hepatitis C, normal TSH, normal LDH. No evidence of elevated inflammatory  markers.  He has a balanced diet with no other concerns for nutritional deficiencies.  Isolated neutropenia in the absence of significant anemia or thrombocytopenia would be unlikely due to B12/folate deficiency.  Plan -No indication for additional workup at this time. -The patient develops other constitutional symptoms might consider doing an autoimmune workup -No indication for G-CSF at this time -Reasonable to take a daily balanced multivitamin -Continue follow-up with primary care physician. -Kindly reconsult if worsening neutropenia or other cytopenias develop. -Patient given a copy of his lab results.  I spent 15 minutes counseling the patient face to face. The total time spent in the appointment was 15 minutes and more than 50% was on counseling and direct patient cares.    Sullivan Lone MD [place] AAHIVMS [facility] Hematology/Oncology Physician [facility]  (Office):       [phone] (Work cell):  [phone] (Fax):           [phone]

## 2015-12-30 ENCOUNTER — Emergency Department (HOSPITAL_COMMUNITY)
Admission: EM | Admit: 2015-12-30 | Discharge: 2015-12-30 | Disposition: A | Discharge status: Home / Self Care | Payer: Commercial Managed Care - HMO | Attending: Emergency Medicine | Admitting: Emergency Medicine

## 2015-12-30 ENCOUNTER — Emergency Department (HOSPITAL_COMMUNITY): Payer: Commercial Managed Care - HMO

## 2015-12-30 ENCOUNTER — Encounter (HOSPITAL_COMMUNITY): Payer: Self-pay | Admitting: Emergency Medicine

## 2015-12-30 DIAGNOSIS — Z87891 Personal history of nicotine dependence: Secondary | ICD-10-CM | POA: Diagnosis not present

## 2015-12-30 DIAGNOSIS — Z23 Encounter for immunization: Secondary | ICD-10-CM | POA: Diagnosis not present

## 2015-12-30 DIAGNOSIS — Y9389 Activity, other specified: Secondary | ICD-10-CM | POA: Diagnosis not present

## 2015-12-30 DIAGNOSIS — R55 Syncope and collapse: Secondary | ICD-10-CM | POA: Insufficient documentation

## 2015-12-30 DIAGNOSIS — Z87438 Personal history of other diseases of male genital organs: Secondary | ICD-10-CM | POA: Insufficient documentation

## 2015-12-30 DIAGNOSIS — Y998 Other external cause status: Secondary | ICD-10-CM | POA: Insufficient documentation

## 2015-12-30 DIAGNOSIS — IMO0002 Reserved for concepts with insufficient information to code with codable children: Secondary | ICD-10-CM

## 2015-12-30 DIAGNOSIS — S0101XA Laceration without foreign body of scalp, initial encounter: Secondary | ICD-10-CM | POA: Diagnosis not present

## 2015-12-30 DIAGNOSIS — Y9289 Other specified places as the place of occurrence of the external cause: Secondary | ICD-10-CM | POA: Diagnosis not present

## 2015-12-30 DIAGNOSIS — W01198A Fall on same level from slipping, tripping and stumbling with subsequent striking against other object, initial encounter: Secondary | ICD-10-CM | POA: Diagnosis not present

## 2015-12-30 DIAGNOSIS — Z8619 Personal history of other infectious and parasitic diseases: Secondary | ICD-10-CM | POA: Diagnosis not present

## 2015-12-30 DIAGNOSIS — S0990XA Unspecified injury of head, initial encounter: Secondary | ICD-10-CM | POA: Diagnosis present

## 2015-12-30 MED ORDER — TETANUS-DIPHTH-ACELL PERTUSSIS 5-2.5-18.5 LF-MCG/0.5 IM SUSP
0.5000 mL | Freq: Once | INTRAMUSCULAR | Status: AC
  Administered 2015-12-30: 0.5 mL via INTRAMUSCULAR
  Filled 2015-12-30: qty 0.5

## 2015-12-30 NOTE — ED Notes (Signed)
Pt states he fell backward tonight striking the back of his head on something  Pt states he had a brief moment of LOC  Pt has a small laceration noted to the back of his head  Bleeding controlled

## 2015-12-30 NOTE — ED Provider Notes (Signed)
CSN: [id number]     Arrival date & time 12/30/15  0032 History   By signing my name below, I, Rohini Rajnarayanan, attest that this documentation has been prepared under the direction and in the presence of Everlene Balls, MD Electronically Signed: Evonnie Dawes, ED Scribe 12/30/2015 at 3:52 AM.    Chief Complaint  Patient presents with  . Fall  . Head Injury   The history is provided by the patient. No language interpreter was used.   HPI Comments: Bruce [name] is a 50 y.o. male who presents to the Emergency Department complaining of head laceration to the back of his head after feeling dizzy and falling backwards and striking his head. Bleeding is currently controlled. Pt was asleep before the incident, and woke up to use the bathroom, when he fell. Pt had a brief moment of LOC, and does not clearly remember all events of the incident. Pt believes he has had a tetanus shot <5 years ago. He denies prodromal symptoms of chest pain, SOB, back pain or abdominal pain.     Past Medical History  Diagnosis Date  . Impotence of organic origin   . Substance abuse 1995  . Erectile dysfunction   . STD (sexually transmitted disease)     Previous history of gonorrhea, chlamydia and genital warts   Past Surgical History  Procedure Laterality Date  . Laceration of head  1991  . Vasectomy    . Hemorrhoid      Patient reports he probably had sclerosis done   Family History  Problem Relation Age of Onset  . Kidney disease Father     due to drug use  . Coronary artery disease Maternal Grandfather   . Hypertension Other   . Cancer Other     Uncle-Leukemia  . Lupus Other     Aunt   Social History  Substance Use Topics  . Smoking status: Former Smoker -- 0.50 packs/day    Quit date: 08/24/1994  . Smokeless tobacco: Never Used     Comment: Ex-smoker quit 20 years ago  . Alcohol Use: No     Comment: rarely/glass of wine. 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  Allergies  Review of patient's allergies indicates no known allergies.  Home Medications   Prior to Admission medications   Medication Sig Start Date End Date Taking? Authorizing Provider  tadalafil (CIALIS) 10 MG tablet Take 10 mg by mouth daily as needed for erectile dysfunction.    Historical Provider, MD   BP 118/77 mmHg  Pulse 65  Temp(Src) 97.9 F (36.6 C) (Oral)  Resp 18  SpO2 100% Physical Exam  Constitutional: He is oriented to person, place, and time. Vital signs are normal. He appears well-developed and well-nourished.  Non-toxic appearance. He does not appear ill. No distress.  HENT:  Head: Normocephalic.  Nose: Nose normal.  Mouth/Throat: Oropharynx is clear and moist. No oropharyngeal exudate.  2 cm laceration to L parietal scalp.  Eyes: Conjunctivae and EOM are normal. Pupils are equal, round, and reactive to light. No scleral icterus.  Neck: Normal range of motion. Neck supple. No tracheal deviation, no edema, no erythema and normal range of motion present. No thyroid mass and no thyromegaly present.  Cardiovascular: Normal rate, regular rhythm, S1 normal, S2 normal, normal heart sounds, intact distal pulses and normal pulses.  Exam reveals no gallop and no friction rub.   No murmur heard. Pulmonary/Chest: Effort normal and breath sounds normal. No respiratory distress. He has no wheezes. He has no rhonchi. He has no rales.  Abdominal: Soft. Normal appearance and bowel sounds are normal. He exhibits no distension, no ascites and no mass. There is no hepatosplenomegaly. There is no tenderness. There is no rebound, no guarding and no CVA tenderness.  Musculoskeletal: Normal range of motion. He exhibits no edema or tenderness.  Lymphadenopathy:    He has no cervical adenopathy.  Neurological: He is alert and oriented to person, place, and time. He has normal strength. No  cranial nerve deficit or sensory deficit.  Normal strength to all 4 extremities. Normal cerebellar tester.   Skin: Skin is warm, dry and intact. No petechiae and no rash noted. He is not diaphoretic. No erythema. No pallor.  Psychiatric: He has a normal mood and affect. His behavior is normal. Judgment normal.  Nursing note and vitals reviewed.   ED Course  Procedures  DIAGNOSTIC STUDIES: Oxygen Saturation is 100% on RA, normal by my interpretation.    COORDINATION OF CARE: 3:38 AM-Discussed treatment plan which includes CT head wo contrast, Tdap, and sutures,  with pt at bedside and pt agreed to plan.   Labs Review Labs Reviewed - No data to display  Imaging Review Ct Head Wo Contrast  12/30/2015  CLINICAL DATA:  Patient became lightheaded and dizzy and passed out, falling. Struck left posterior head on a dresser. Loss of consciousness. EXAM: CT HEAD WITHOUT CONTRAST TECHNIQUE: Contiguous axial images were obtained from the base of the skull through the vertex without intravenous contrast. COMPARISON:  None. FINDINGS: Ventricles and sulci are symmetrical. No ventricular dilatation. No mass effect or midline shift. No abnormal extra-axial fluid collections. Gray-white matter junctions are distinct. Basal cisterns are not effaced. No evidence of acute intracranial hemorrhage. No depressed skull fractures. Visualized paranasal sinuses and mastoid air cells are not opacified. Metallic foreign body demonstrated in the subcutaneous scalp soft tissue of the right parietal region. IMPRESSION: No acute intracranial abnormalities. Electronically Signed   By: Lucienne Capers M.D.   On: 12/30/2015 03:32   I have personally reviewed and evaluated these images and lab results as part of my medical decision-making.   EKG Interpretation None      MDM   Final diagnoses:  None    Patient presents to the ED for syncope and head lac.  Will obtain EKG.  Tetanus was updated and wound repaired with  staples. Neuro exam here is normal and CT is negative.  Wound care instructions provided.  He appears well and in NAD.  Advised to see PCP within 7-10 days for wound check.  He demonstrates good understanding.  VS remain within his normal limits and he is safe for DC.   LACERATION REPAIR Performed by: Everlene Balls Authorized byEverlene Balls Consent: Verbal consent obtained. Risks and benefits: risks, benefits and alternatives were discussed Consent given by: patient Patient identity confirmed: provided demographic data Prepped and Draped in normal sterile fashion Wound explored  Laceration Location: L parietal scalp  Laceration Length: 2 cm  No Foreign Bodies seen or palpated  Anesthesia: none  Irrigation method: syringe Amount of cleaning: standard  Skin closure: staples  Number of staples: 3  Patient tolerance: Patient tolerated the procedure well with no immediate complications.   I personally performed the services described in this documentation, which was scribed in my presence. The recorded information has been  reviewed and is accurate.      Everlene Balls, MD 12/30/15 [id number]

## 2015-12-30 NOTE — Discharge Instructions (Signed)
Laceration Care, Adult Bruce [name], you need a wound check with your primary care doctor in 7-10 days to see if your staples can come out.  Wash the area with normal soap and water to prevent infection.  Come back to the ED immediately for any worsening symptoms or concern for infection. Thank you. A laceration is a cut that goes through all layers of the skin. The cut also goes into the tissue that is right under the skin. Some cuts heal on their own. Others need to be closed with stitches (sutures), staples, skin adhesive strips, or wound glue. Taking care of your cut lowers your risk of infection and helps your cut to heal better. HOW TO TAKE CARE OF YOUR CUT For stitches or staples:  Keep the wound clean and dry.  If you were given a bandage (dressing), you should change it at least one time per day or as told by your doctor. You should also change it if it gets wet or dirty.  Keep the wound completely dry for the first 24 hours or as told by your doctor. After that time, you may take a shower or a bath. However, make sure that the wound is not soaked in water until after the stitches or staples have been removed.  Clean the wound one time each day or as told by your doctor:  Wash the wound with soap and water.  Rinse the wound with water until all of the soap comes off.  Pat the wound dry with a clean towel. Do not rub the wound.  After you clean the wound, put a thin layer of antibiotic ointment on it as told by your doctor. This ointment:  Helps to prevent infection.  Keeps the bandage from sticking to the wound.  Have your stitches or staples removed as told by your doctor. If your doctor used skin adhesive strips:   Keep the wound clean and dry.  If you were given a bandage, you should change it at least one time per day or as told by your doctor. You should also change it if it gets dirty or wet.  Do not get the skin adhesive strips wet. You can take a shower or a bath,  but be careful to keep the wound dry.  If the wound gets wet, pat it dry with a clean towel. Do not rub the wound.  Skin adhesive strips fall off on their own. You can trim the strips as the wound heals. Do not remove any strips that are still stuck to the wound. They will fall off after a while. If your doctor used wound glue:  Try to keep your wound dry, but you may briefly wet it in the shower or bath. Do not soak the wound in water, such as by swimming.  After you take a shower or a bath, gently pat the wound dry with a clean towel. Do not rub the wound.  Do not do any activities that will make you really sweaty until the skin glue has fallen off on its own.  Do not apply liquid, cream, or ointment medicine to your wound while the skin glue is still on.  If you were given a bandage, you should change it at least one time per day or as told by your doctor. You should also change it if it gets dirty or wet.  If a bandage is placed over the wound, do not let the tape for the bandage touch the skin  glue.  Do not pick at the glue. The skin glue usually stays on for 5-10 days. Then, it falls off of the skin. General Instructions  To help prevent scarring, make sure to cover your wound with sunscreen whenever you are outside after stitches are removed, after adhesive strips are removed, or when wound glue stays in place and the wound is healed. Make sure to wear a sunscreen of at least 30 SPF.  Take over-the-counter and prescription medicines only as told by your doctor.  If you were given antibiotic medicine or ointment, take or apply it as told by your doctor. Do not stop using the antibiotic even if your wound is getting better.  Do not scratch or pick at the wound.  Keep all follow-up visits as told by your doctor. This is important.  Check your wound every day for signs of infection. Watch for:  Redness, swelling, or pain.  Fluid, blood, or pus.  Raise (elevate) the injured  area above the level of your heart while you are sitting or lying down, if possible. GET HELP IF:  You got a tetanus shot and you have any of these problems at the injection site:  Swelling.  Very bad pain.  Redness.  Bleeding.  You have a fever.  A wound that was closed breaks open.  You notice a bad smell coming from your wound or your bandage.  You notice something coming out of the wound, such as wood or glass.  Medicine does not help your pain.  You have more redness, swelling, or pain at the site of your wound.  You have fluid, blood, or pus coming from your wound.  You notice a change in the color of your skin near your wound.  You need to change the bandage often because fluid, blood, or pus is coming from the wound.  You start to have a new rash.  You start to have numbness around the wound. GET HELP RIGHT AWAY IF:  You have very bad swelling around the wound.  Your pain suddenly gets worse and is very bad.  You notice painful lumps near the wound or on skin that is anywhere on your body.  You have a red streak going away from your wound.  The wound is on your hand or foot and you cannot move a finger or toe like you usually can.  The wound is on your hand or foot and you notice that your fingers or toes look pale or bluish.   This information is not intended to replace advice given to you by your health care provider. Make sure you discuss any questions you have with your health care provider.   Document Released: 03/27/2008 Document Revised: 02/23/2015 Document Reviewed: 10/05/2014 Elsevier Interactive Patient Education [employer].

## 2016-01-06 ENCOUNTER — Encounter: Payer: Self-pay | Admitting: Family Medicine

## 2016-01-06 ENCOUNTER — Ambulatory Visit (INDEPENDENT_AMBULATORY_CARE_PROVIDER_SITE_OTHER): Payer: Commercial Managed Care - HMO | Admitting: Family Medicine

## 2016-01-06 VITALS — BP 112/68 | HR 61 | Temp 97.8°F | Ht 70.0 in | Wt 167.6 lb

## 2016-01-06 DIAGNOSIS — T148 Other injury of unspecified body region: Secondary | ICD-10-CM

## 2016-01-06 DIAGNOSIS — IMO0002 Reserved for concepts with insufficient information to code with codable children: Secondary | ICD-10-CM

## 2016-01-06 DIAGNOSIS — R55 Syncope and collapse: Secondary | ICD-10-CM

## 2016-01-06 DIAGNOSIS — S0990XA Unspecified injury of head, initial encounter: Secondary | ICD-10-CM

## 2016-01-06 NOTE — Progress Notes (Signed)
HPI:  Bruce [name] is a pleasant 50 year old with a past medical history of benign ethnic neutropenia (evaluated by hematology) here for a follow-up visit for suture removal. He stood up suddenly to use the restroom several weeks ago and fell suffering a laceration to the head. He was seen in the emergency room on 12/30/2015. Per notes EKG and CT scan of the head were all normal and 3 staples were placed to close the laceration. He was told to follow up here for staple removal in 7-10 days.  He has never had any issues with syncope, presyncope, chest pain, palpitations, dyspnea w/ exertion or seizures.The episode occurred in the middle the night when he got up suddenly and had just urinated and started to feel lightheaded, then had possible brief LOC with head injury. He had an extensive evaluation in emergency room. Feels great now. Wound has healed and has no bruising, swelling, headache, dizziness or pain.  ROS: See pertinent positives and negatives per HPI.  Past Medical History  Diagnosis Date  . Impotence of organic origin   . Substance abuse 1995  . Erectile dysfunction   . STD (sexually transmitted disease)     Previous history of gonorrhea, chlamydia and genital warts    Past Surgical History  Procedure Laterality Date  . Laceration of head  1991  . Vasectomy    . Hemorrhoid      Patient reports he probably had sclerosis done    Family History  Problem Relation Age of Onset  . Kidney disease Father     due to drug use  . Coronary artery disease Maternal Grandfather   . Hypertension Other   . Cancer Other     Uncle-Leukemia  . Lupus Other     Aunt    Social History   Social History  . Marital Status: Married    Spouse Name: Nocita  . Number of Children: 7  . Years of Education: N/A   Occupational History  . Sanitation Worker     [employer] [employer] History Main Topics  . Smoking status: Former Smoker -- 0.50 packs/day    Quit date: 08/24/1994   . Smokeless tobacco: Never Used     Comment: Ex-smoker quit 20 years ago  . Alcohol Use: No     Comment: rarely/glass of wine. 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 Drug Use: No     Comment: previous h/o marijuana and cocaine use -sober 20 years    . Sexual Activity: Yes   Other Topics Concern  . None   Social History Narrative   5 daughters, 2 sons   Quit smoking over 20 years ago           Current outpatient prescriptions:  .  tadalafil (CIALIS) 10 MG tablet, Take 10 mg by mouth daily as needed for erectile dysfunction., Disp: , Rfl:   EXAM:  Filed Vitals:   01/06/16 1316  BP: 112/68  Pulse: 61  Temp: 97.8 F (36.6 C)    Body mass index is 24.05 kg/(m^2).  GENERAL: vitals reviewed and listed above, alert, oriented, appears well hydrated and in no acute distress  HEENT: atraumatic, conjunttiva clear, no obvious abnormalities on inspection of external nose and ears  NECK: no obvious masses on inspection  LUNGS: clear to auscultation bilaterally, no wheezes, rales or rhonchi, good air movement  CV: HRRR, no peripheral edema  MS: moves all extremities without noticeable abnormality  SKIN:  well healed lac L parietal region. No redness, drainage, swelling or TTP.  PSYCH: pleasant and cooperative, no obvious depression or anxiety  ASSESSMENT AND PLAN:  Discussed the following assessment and plan:  Laceration  Syncope, unspecified syncope type  Head injury, initial encounter  - he is doing well -Staples removed - various causes of syncope discussed, suspect micturition related and advised if any further symptoms or recurrence prompt evaluation. -Patient advised to return or notify a doctor immediately if symptoms worsen or persist or new concerns arise.  There are no Patient Instructions on file for this visit.   Colin Benton R.

## 2016-01-06 NOTE — Progress Notes (Signed)
Pre visit review using our clinic review tool, if applicable. No additional management support is needed unless otherwise documented below in the visit note. 

## 2016-04-27 IMAGING — CT CT HEAD W/O CM
2 series · 17 of 30 positions shown, 20 images · non-contrast
Comparison: None.

CLINICAL DATA: Patient became lightheaded and dizzy and passed out,
falling. Struck left posterior head on a dresser. Loss of
consciousness.

EXAM:
CT HEAD WITHOUT CONTRAST
TECHNIQUE: Contiguous axial images were obtained from the base of the skull
through the vertex without intravenous contrast.

[Series 2: head w/o · axial · non-contrast · 0.45mm/px · z∈[-68,+62]mm · 9 of 34 slices shown, 12 images]
[im 4/34  brain]
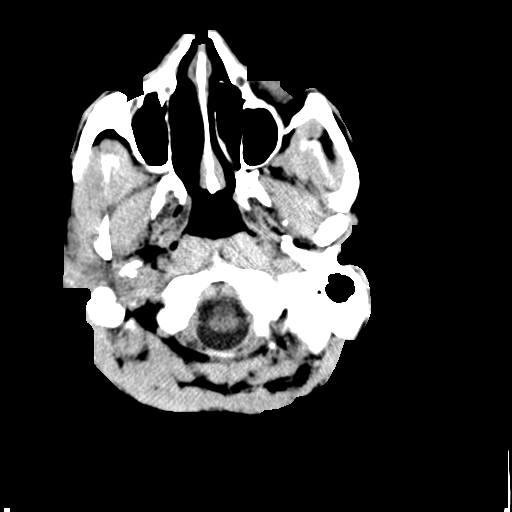
[im 4/34  bone]
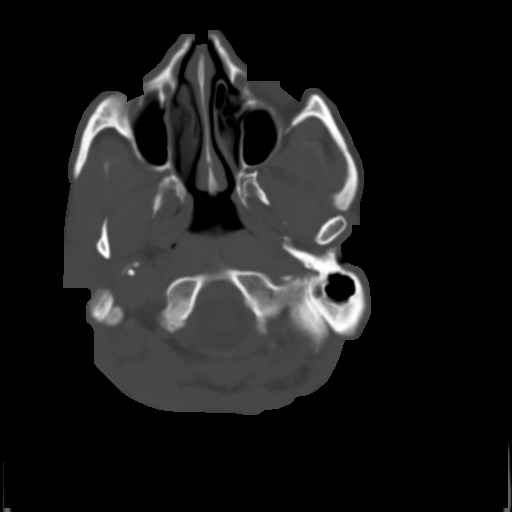
[im 7/34  brain]
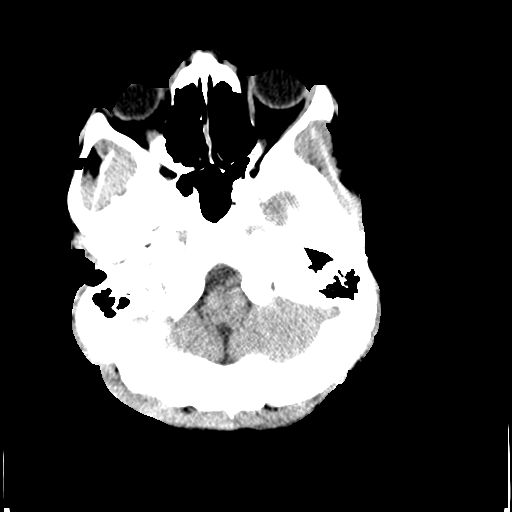
[im 10/34  brain]
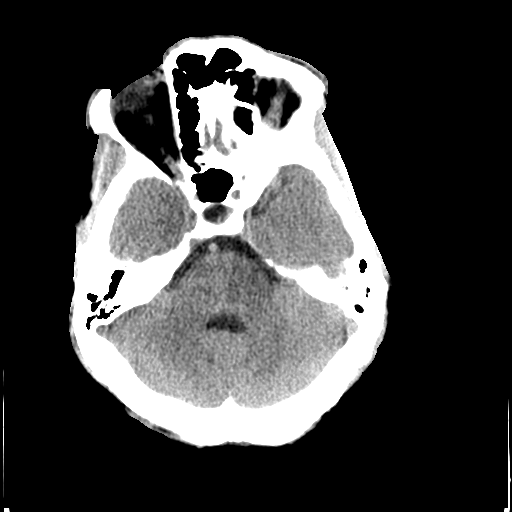
[im 14/34  brain]
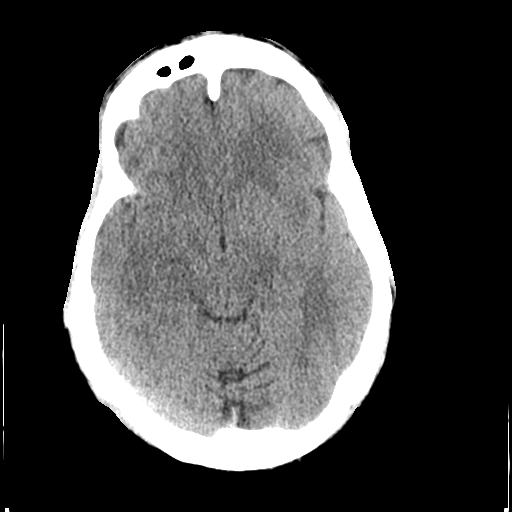
[im 17/34  brain]
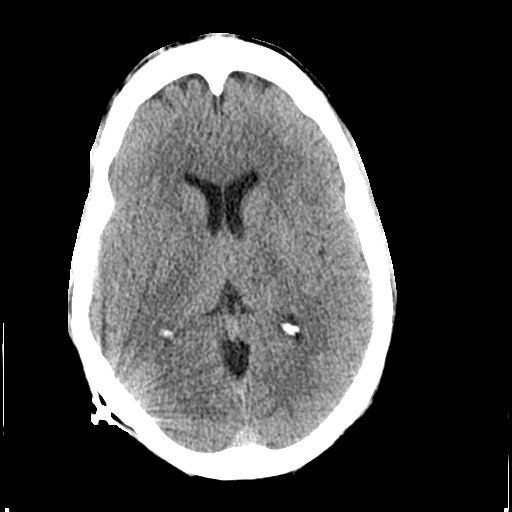
[im 17/34  bone]
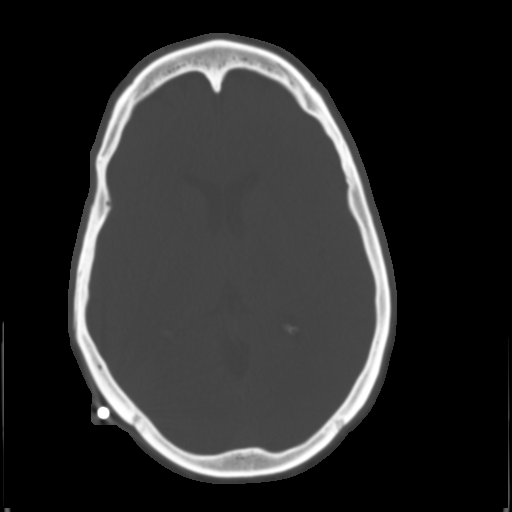
[im 20/34  brain]
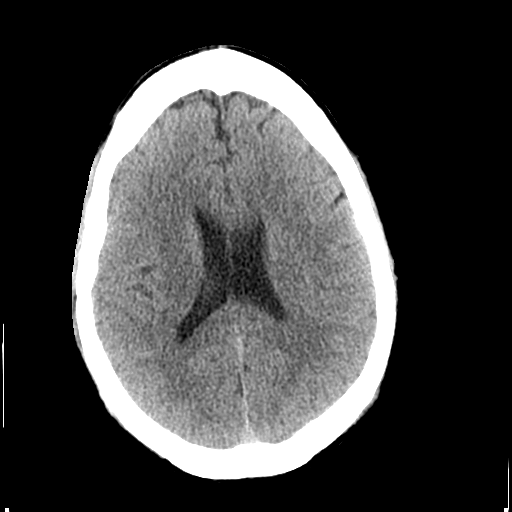
[im 24/34  brain]
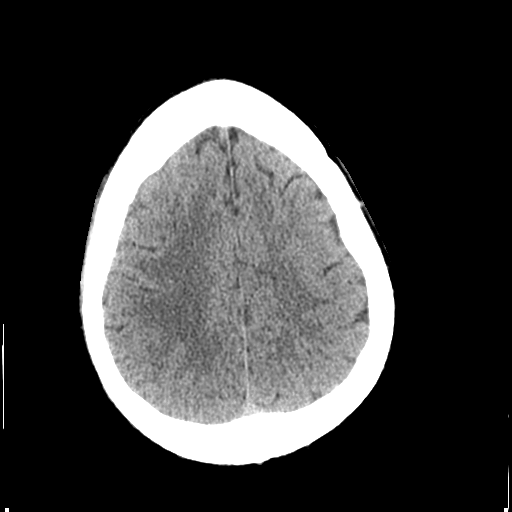
[im 27/34  brain]
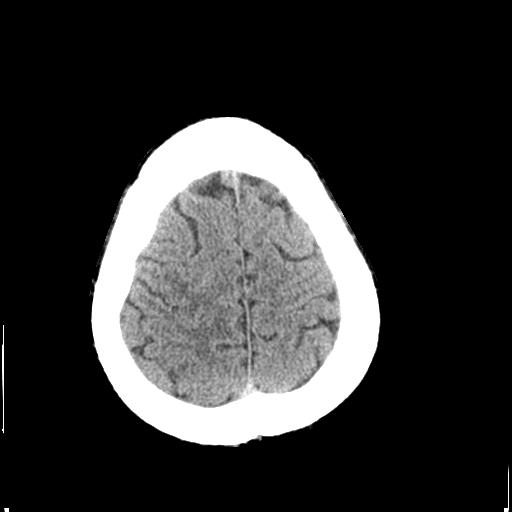
[im 30/34  brain]
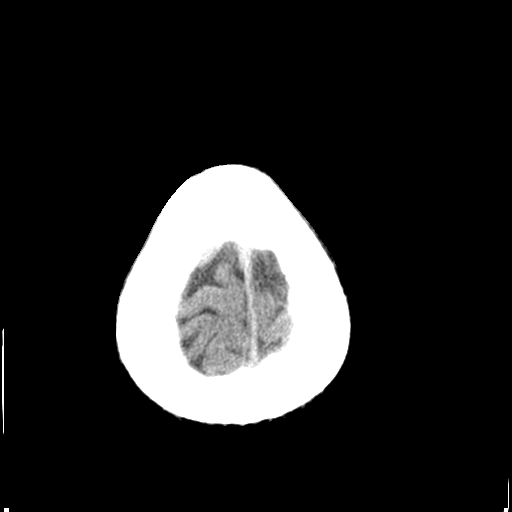
[im 30/34  bone]
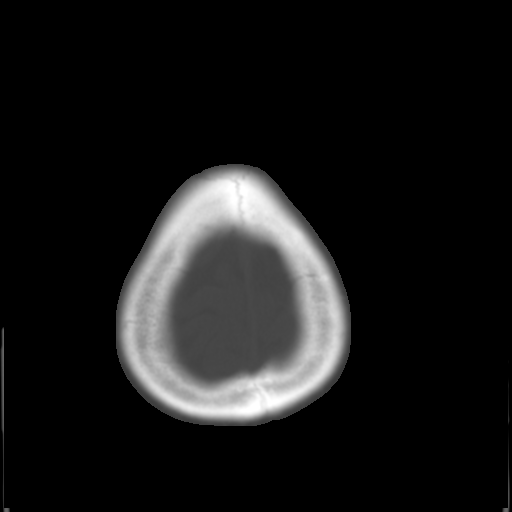

[Series 3: bone windows · axial · 0.45mm/px · z∈[-65,+64]mm · 8 of 57 slices shown]
[im 7/57  bone]
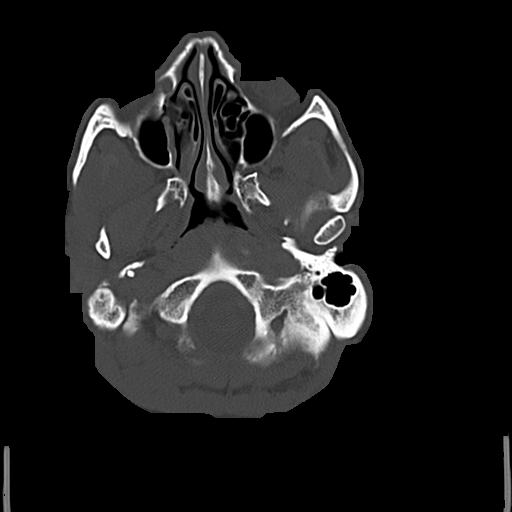
[im 13/57  bone]
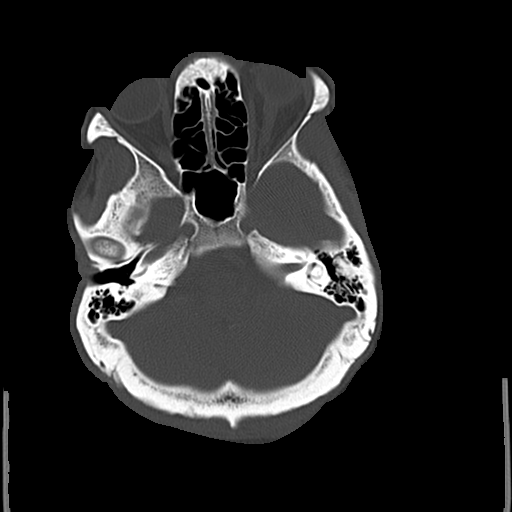
[im 19/57  bone]
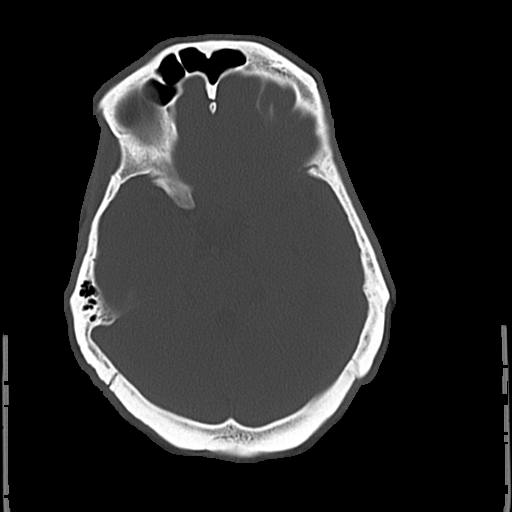
[im 25/57  bone]
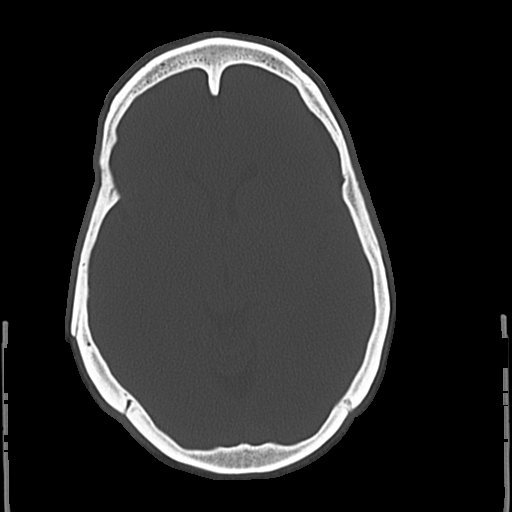
[im 32/57  bone]
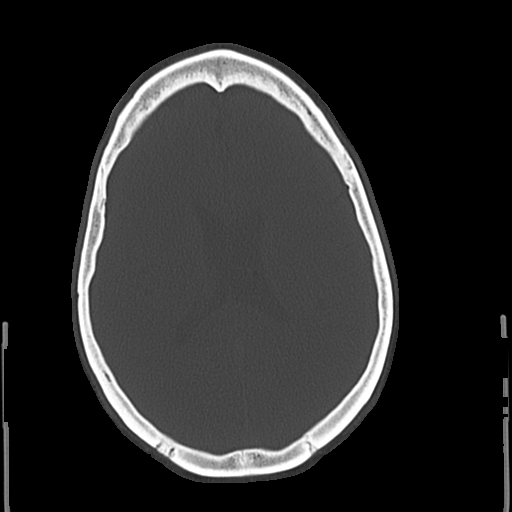
[im 38/57  bone]
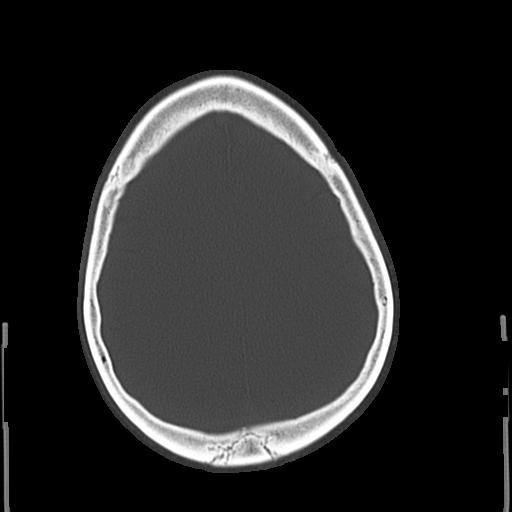
[im 44/57  bone]
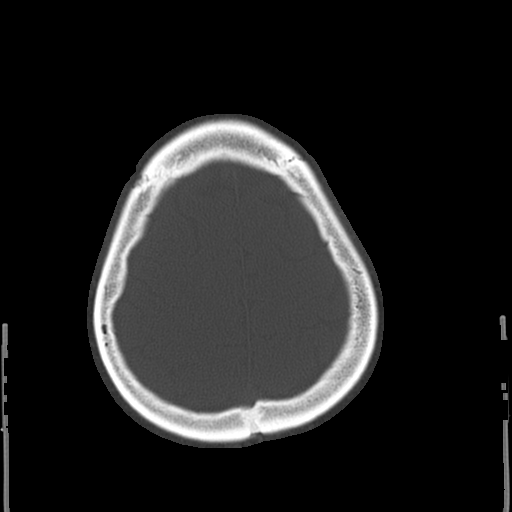
[im 50/57  bone]
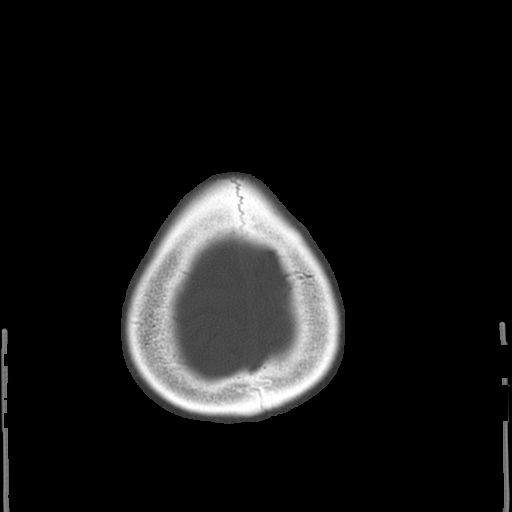

[17 of 30 positions shown; findings below may reference images not displayed]

FINDINGS: Ventricles and sulci are symmetrical. No ventricular dilatation. No
mass effect or midline shift. No abnormal extra-axial fluid
collections. Gray-white matter junctions are distinct. Basal
cisterns are not effaced. No evidence of acute intracranial
hemorrhage. No depressed skull fractures. Visualized paranasal
sinuses and mastoid air cells are not opacified. Metallic foreign
body demonstrated in the subcutaneous scalp soft tissue of the right
parietal region.
IMPRESSION: No acute intracranial abnormalities.

## 2016-07-19 ENCOUNTER — Ambulatory Visit (INDEPENDENT_AMBULATORY_CARE_PROVIDER_SITE_OTHER): Payer: Commercial Managed Care - HMO | Admitting: Adult Health

## 2016-07-19 ENCOUNTER — Encounter: Payer: Self-pay | Admitting: Adult Health

## 2016-07-19 VITALS — BP 100/56 | Temp 98.6°F | Ht 70.0 in | Wt 161.0 lb

## 2016-07-19 DIAGNOSIS — B359 Dermatophytosis, unspecified: Secondary | ICD-10-CM

## 2016-07-19 NOTE — Progress Notes (Signed)
Subjective:    Patient ID: Bruce [name], male    DOB: [birth year], 50 y.o.   MRN: [id number]  Rash  This is a new problem. The current episode started in the past 7 days. The problem has been gradually improving since onset. The affected locations include the scalp and face. The rash is characterized by itchiness, dryness, redness and scaling. Associated with: family members have ring worm  Pertinent negatives include no facial edema or fever. Treatments tried: anti fungal       Review of Systems  Constitutional: Negative for fever.  Skin: Positive for color change and rash. Negative for pallor and wound.   Past Medical History:  Diagnosis Date  . Erectile dysfunction   . Impotence of organic origin   . STD (sexually transmitted disease)    Previous history of gonorrhea, chlamydia and genital warts  . Substance abuse 1995    Social History   Social History  . Marital status: Married    Spouse name: Nocita  . Number of children: 7  . Years of education: N/A   Occupational History  . Sanitation Worker     [employer] History Main Topics  . Smoking status: Former Smoker    Packs/day: 0.50    Quit date: 08/24/1994  . Smokeless tobacco: Never Used     Comment: Ex-smoker quit 20 years ago  . Alcohol use No     Comment: rarely/glass of wine. 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 Drug use: No     Comment: previous h/o marijuana and cocaine use -sober 20 years    . Sexual activity: Yes   Other Topics Concern  . Not on file   Social History Narrative   5 daughters, 2 sons   Quit smoking over 20 years ago          Past Surgical History:  Procedure Laterality Date  . hemorrhoid     Patient reports he probably had sclerosis done  . laceration of head  1991  . VASECTOMY      Family History  Problem Relation Age of Onset  . Kidney disease Father     due to drug use  . Coronary artery disease Maternal Grandfather   .  Hypertension Other   . Cancer Other     Uncle-Leukemia  . Lupus Other     Aunt    No Known Allergies  Current Outpatient Prescriptions on File Prior to Visit  Medication Sig Dispense Refill  . tadalafil (CIALIS) 10 MG tablet Take 10 mg by mouth daily as needed for erectile dysfunction.     No current facility-administered medications on file prior to visit.     BP (!) 100/56   Temp 98.6 F (37 C) (Oral)   Ht 5\' 10"  (1.778 m)   Wt 161 lb (73 kg)   BMI 23.10 kg/m       Objective:   Physical Exam  Constitutional: He is oriented to person, place, and time. He appears well-developed and well-nourished. No distress.  Neurological: He is alert and oriented to person, place, and time.  Skin: Skin is warm and dry. No rash noted. He is not diaphoretic. No erythema. No pallor.  Psychiatric: He has a normal mood and affect. His behavior is normal. Judgment and thought content normal.  Vitals reviewed.     Assessment & Plan:  1. Ringworm - No signs of ringworm on scalp or face. He  has bene treating with his wifes antifungal cream for the last week. Seems to have resolved - Follow up with recurrence  - Can always use OTC Lamisil   Dorothyann Peng, NP

## 2016-10-04 ENCOUNTER — Ambulatory Visit (INDEPENDENT_AMBULATORY_CARE_PROVIDER_SITE_OTHER): Payer: Commercial Managed Care - HMO | Admitting: Adult Health

## 2016-10-04 ENCOUNTER — Encounter: Payer: Self-pay | Admitting: Adult Health

## 2016-10-04 VITALS — BP 124/80 | HR 55 | Temp 98.0°F | Ht 71.0 in | Wt 159.4 lb

## 2016-10-04 DIAGNOSIS — L02811 Cutaneous abscess of head [any part, except face]: Secondary | ICD-10-CM | POA: Diagnosis not present

## 2016-10-04 DIAGNOSIS — Z0000 Encounter for general adult medical examination without abnormal findings: Secondary | ICD-10-CM

## 2016-10-04 LAB — LIPID PANEL
CHOLESTEROL: 144 mg/dL (ref 0–200)
HDL: 44.7 mg/dL (ref >39.00)
LDL Cholesterol: 86 mg/dL (ref 0–99)
NonHDL: 98.97
Total CHOL/HDL Ratio: 3
Triglycerides: 64 mg/dL (ref 0.0–149.0)
VLDL: 12.8 mg/dL (ref 0.0–40.0)

## 2016-10-04 LAB — POC URINALSYSI DIPSTICK (AUTOMATED)
Bilirubin, UA: NEGATIVE
Glucose, UA: NEGATIVE
KETONES UA: NEGATIVE
LEUKOCYTES UA: NEGATIVE
Nitrite, UA: NEGATIVE
Spec Grav, UA: 1.015
Urobilinogen, UA: 0.2
pH, UA: 6.5

## 2016-10-04 LAB — HEPATIC FUNCTION PANEL
ALBUMIN: 4.4 g/dL (ref 3.5–5.2)
ALT: 13 U/L (ref 0–53)
AST: 21 U/L (ref 0–37)
Alkaline Phosphatase: 40 U/L (ref 39–117)
Bilirubin, Direct: 0.3 mg/dL (ref 0.0–0.3)
Total Bilirubin: 1.5 mg/dL — ABNORMAL HIGH (ref 0.2–1.2)
Total Protein: 6.9 g/dL (ref 6.0–8.3)

## 2016-10-04 LAB — BASIC METABOLIC PANEL
BUN: 8 mg/dL (ref 6–23)
CHLORIDE: 104 meq/L (ref 96–112)
CO2: 28 meq/L (ref 19–32)
Calcium: 9.3 mg/dL (ref 8.4–10.5)
Creatinine, Ser: 1.02 mg/dL (ref 0.40–1.50)
GFR: 100.25 mL/min (ref >60.00)
GLUCOSE: 90 mg/dL (ref 70–99)
POTASSIUM: 4.5 meq/L (ref 3.5–5.1)
SODIUM: 139 meq/L (ref 135–145)

## 2016-10-04 LAB — CBC WITH DIFFERENTIAL/PLATELET
BASOS PCT: 0.4 % (ref 0.0–3.0)
Basophils Absolute: 0 10*3/uL (ref 0.0–0.1)
EOS PCT: 2.7 % (ref 0.0–5.0)
Eosinophils Absolute: 0.1 10*3/uL (ref 0.0–0.7)
HCT: 42.8 % (ref 39.0–52.0)
Hemoglobin: 14.6 g/dL (ref 13.0–17.0)
LYMPHS ABS: 1.7 10*3/uL (ref 0.7–4.0)
Lymphocytes Relative: 48.3 % — ABNORMAL HIGH (ref 12.0–46.0)
MCHC: 34 g/dL (ref 30.0–36.0)
MCV: 87.6 fl (ref 78.0–100.0)
MONOS PCT: 10.9 % (ref 3.0–12.0)
Monocytes Absolute: 0.4 10*3/uL (ref 0.1–1.0)
NEUTROS ABS: 1.4 10*3/uL (ref 1.4–7.7)
NEUTROS PCT: 37.7 % — AB (ref 43.0–77.0)
PLATELETS: 165 10*3/uL (ref 150.0–400.0)
RBC: 4.89 Mil/uL (ref 4.22–5.81)
RDW: 13.6 % (ref 11.5–15.5)
WBC: 3.6 10*3/uL — ABNORMAL LOW (ref 4.0–10.5)

## 2016-10-04 LAB — TSH: TSH: 1.93 u[IU]/mL (ref 0.35–4.50)

## 2016-10-04 LAB — PSA: PSA: 0.75 ng/mL (ref 0.10–4.00)

## 2016-10-04 MED ORDER — DOXYCYCLINE HYCLATE 100 MG PO CAPS: ORAL_CAPSULE | ORAL | 3 refills | Status: DC

## 2016-10-04 NOTE — Progress Notes (Signed)
Subjective:    Patient ID: Bruce [name], male    DOB: [birth year], 50 y.o.   MRN: [id number]  HPI Patient presents for yearly preventative medicine examination. He is a pleasant 50 year old male who  has a past medical history of Erectile dysfunction; Impotence of organic origin; STD (sexually transmitted disease); and Substance abuse (1995).  All immunizations and health maintenance protocols were reviewed with the patient and needed orders were placed. He is up to date  Appropriate screening laboratory values were ordered for the patient including screening of hyperlipidemia, renal function and hepatic function. If indicated by BPH, a PSA was ordered.  Medication reconciliation, past medical history, social history, problem list and allergies were reviewed in detail with the patient  Goals were established with regard to weight loss, exercise, and  diet in compliance with medications. He is not exercising nor is he eating healthy.   His only acute concern is that of small " bumps " on his scalp. This has been an ongoing issue for him. He reports that he will pop them and then place hydrogen peroxide on the bumps.    Review of Systems  Constitutional: Negative.   HENT: Negative.   Eyes: Negative.   Respiratory: Negative.   Cardiovascular: Negative.   Gastrointestinal: Negative.   Endocrine: Negative.   Genitourinary: Negative.   Musculoskeletal: Negative.   Skin: Positive for wound.  Allergic/Immunologic: Negative.   Neurological: Negative.   Hematological: Negative.   Psychiatric/Behavioral: Negative.   All other systems reviewed and are negative.  Past Medical History:  Diagnosis Date  . Erectile dysfunction   . Impotence of organic origin   . STD (sexually transmitted disease)    Previous history of gonorrhea, chlamydia and genital warts  . Substance abuse 1995    Social History   Social History  . Marital status: Married    Spouse name: Nocita  . Number  of children: 7  . Years of education: N/A   Occupational History  . 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 Smoking status: Former Smoker    Packs/day: 0.50    Quit date: 08/24/1994  . Smokeless tobacco: Never Used     Comment: Ex-smoker quit 20 years ago  . Alcohol use No     Comment: rarely/glass of wine. 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 Drug use: No     Comment: previous h/o marijuana and cocaine use -sober 20 years    . Sexual activity: Yes   Other Topics Concern  . Not on file   Social History Narrative   5 daughters, 2 sons   Quit smoking over 20 years ago          Past Surgical History:  Procedure Laterality Date  . hemorrhoid     Patient reports he probably had sclerosis done  . laceration of head  1991  . VASECTOMY      Family History  Problem Relation Age of Onset  . Kidney disease Father     due to drug use  . Coronary artery disease Maternal Grandfather   . Hypertension Other   . Cancer Other     Uncle-Leukemia  . Lupus Other     Aunt    No Known Allergies  Current Outpatient Prescriptions on File Prior to Visit  Medication Sig Dispense Refill  . tadalafil (CIALIS) 10 MG tablet Take 10 mg by mouth  daily as needed for erectile dysfunction.     No current facility-administered medications on file prior to visit.     BP 124/80 (BP Location: Right Arm, Patient Position: Sitting, Cuff Size: Normal)   Pulse (!) 55   Temp 98 F (36.7 C) (Oral)   Ht 5\' 11"  (1.803 m)   Wt 159 lb 6.4 oz (72.3 kg)   SpO2 97%   BMI 22.23 kg/m       Objective:   Physical Exam  Constitutional: He is oriented to person, place, and time. He appears well-developed and well-nourished. No distress.  HENT:  Head: Normocephalic and atraumatic.  Right Ear: External ear normal.  Left Ear: External ear normal.  Nose: Nose normal.  Mouth/Throat: Oropharynx is clear and moist. No oropharyngeal exudate.  Eyes:  Conjunctivae and EOM are normal. Pupils are equal, round, and reactive to light. Right eye exhibits no discharge. Left eye exhibits no discharge. No scleral icterus.  Neck: Normal range of motion. Neck supple. No JVD present. No tracheal deviation present. No thyromegaly present.  Cardiovascular: Normal rate, regular rhythm, normal heart sounds and intact distal pulses.  Exam reveals no gallop and no friction rub.   No murmur heard. Pulmonary/Chest: Effort normal and breath sounds normal. No stridor. No respiratory distress. He has no wheezes. He has no rales. He exhibits no tenderness.  Abdominal: Soft. Bowel sounds are normal. He exhibits no distension. There is no tenderness. There is no rebound and no guarding.  Musculoskeletal: Normal range of motion. He exhibits no edema, tenderness or deformity.  Lymphadenopathy:    He has no cervical adenopathy.  Neurological: He is alert and oriented to person, place, and time. He has normal reflexes. He displays normal reflexes. No cranial nerve deficit. He exhibits normal muscle tone. Coordination normal.  Skin: Skin is warm and dry. No rash noted. He is not diaphoretic. No erythema. No pallor.  Small abscesses on top of scalp. Do not appear to be MRSA.   Psychiatric: He has a normal mood and affect. Judgment and thought content normal.  Nursing note and vitals reviewed.     Assessment & Plan:  1. Routine general medical examination at a health care facility - Educated on the importance of diet and exercise - Follow up in one year or sooner if needed - Basic metabolic panel - CBC with Differential/Platelet - Hepatic function panel - Lipid panel - POCT Urinalysis Dipstick (Automated) - PSA - TSH  2. Abscess of scalp - Advised OTC acne wash on scalp daily. Can use Doxycycline for acute flare - doxycycline (VIBRAMYCIN) 100 MG capsule; One capsule, two times per day for one week during flares  Dispense: 60 capsule; Refill: 3  Dorothyann Peng,  NP

## 2016-10-04 NOTE — Patient Instructions (Signed)
It was great seeing you today and Happy Birthday!  I will follow up with you regarding you blood work  As discussed use an over the counter acne wash such as Cetaphil or Neutrogena on your head. I have prescribed Doxycycline you can use with flare ups.   Work on diet and exercise  Follow up in one year or sooner if needed  Health Maintenance, Male A healthy lifestyle and preventative care can promote health and wellness.  Maintain regular health, dental, and eye exams.  Eat a healthy diet. Foods like vegetables, fruits, whole grains, low-fat dairy products, and lean protein foods contain the nutrients you need and are low in calories. Decrease your intake of foods high in solid fats, added sugars, and salt. Get information about a proper diet from your health care provider, if necessary.  Regular physical exercise is one of the most important things you can do for your health. Most adults should get at least 150 minutes of moderate-intensity exercise (any activity that increases your heart rate and causes you to sweat) each week. In addition, most adults need muscle-strengthening exercises on 2 or more days a week.   Maintain a healthy weight. The body mass index (BMI) is a screening tool to identify possible weight problems. It provides an estimate of body fat based on height and weight. Your health care provider can find your BMI and can help you achieve or maintain a healthy weight. For males 20 years and older:  A BMI below 18.5 is considered underweight.  A BMI of 18.5 to 24.9 is normal.  A BMI of 25 to 29.9 is considered overweight.  A BMI of 30 and above is considered obese.  Maintain normal blood lipids and cholesterol by exercising and minimizing your intake of saturated fat. Eat a balanced diet with plenty of fruits and vegetables. Blood tests for lipids and cholesterol should begin at age 74 and be repeated every 5 years. If your lipid or cholesterol levels are high, you are  over age 82, or you are at high risk for heart disease, you may need your cholesterol levels checked more frequently.Ongoing high lipid and cholesterol levels should be treated with medicines if diet and exercise are not working.  If you smoke, find out from your health care provider how to quit. If you do not use tobacco, do not start.  Lung cancer screening is recommended for adults aged 51-80 years who are at high risk for developing lung cancer because of a history of smoking. A yearly low-dose CT scan of the lungs is recommended for people who have at least a 30-pack-year history of smoking and are current smokers or have quit within the past 15 years. A pack year of smoking is smoking an average of 1 pack of cigarettes a day for 1 year (for example, a 30-pack-year history of smoking could mean smoking 1 pack a day for 30 years or 2 packs a day for 15 years). Yearly screening should continue until the smoker has stopped smoking for at least 15 years. Yearly screening should be stopped for people who develop a health problem that would prevent them from having lung cancer treatment.  If you choose to drink alcohol, do not have more than 2 drinks per day. One drink is considered to be 12 oz (360 mL) of beer, 5 oz (150 mL) of wine, or 1.5 oz (45 mL) of liquor.  Avoid the use of street drugs. Do not share needles with anyone. Ask  for help if you need support or instructions about stopping the use of drugs.  High blood pressure causes heart disease and increases the risk of stroke. High blood pressure is more likely to develop in:  People who have blood pressure in the end of the normal range (100-139/85-89 mm Hg).  People who are overweight or obese.  People who are African American.  If you are 74-42 years of age, have your blood pressure checked every 3-5 years. If you are 91 years of age or older, have your blood pressure checked every year. You should have your blood pressure measured  twice--once when you are at a hospital or clinic, and once when you are not at a hospital or clinic. Record the average of the two measurements. To check your blood pressure when you are not at a hospital or clinic, you can use:  An automated blood pressure machine at a pharmacy.  A home blood pressure monitor.  If you are 24-52 years old, ask your health care provider if you should take aspirin to prevent heart disease.  Diabetes screening involves taking a blood sample to check your fasting blood sugar level. This should be done once every 3 years after age 70 if you are at a normal weight and without risk factors for diabetes. Testing should be considered at a younger age or be carried out more frequently if you are overweight and have at least 1 risk factor for diabetes.  Colorectal cancer can be detected and often prevented. Most routine colorectal cancer screening begins at the age of 10 and continues through age 98. However, your health care provider may recommend screening at an earlier age if you have risk factors for colon cancer. On a yearly basis, your health care provider may provide home test kits to check for hidden blood in the stool. A small camera at the end of a tube may be used to directly examine the colon (sigmoidoscopy or colonoscopy) to detect the earliest forms of colorectal cancer. Talk to your health care provider about this at age 3 when routine screening begins. A direct exam of the colon should be repeated every 5-10 years through age 11, unless early forms of precancerous polyps or small growths are found.  People who are at an increased risk for hepatitis B should be screened for this virus. You are considered at high risk for hepatitis B if:  You were born in a country where hepatitis B occurs often. Talk with your health care provider about which countries are considered high risk.  Your parents were born in a high-risk country and you have not received a shot to  protect against hepatitis B (hepatitis B vaccine).  You have HIV or AIDS.  You use needles to inject street drugs.  You live with, or have sex with, someone who has hepatitis B.  You are a man who has sex with other men (MSM).  You get hemodialysis treatment.  You take certain medicines for conditions like cancer, organ transplantation, and autoimmune conditions.  Hepatitis C blood testing is recommended for all people born from [birth year] through 1963 and any individual with known risk factors for hepatitis C.  Healthy men should no longer receive prostate-specific antigen (PSA) blood tests as part of routine cancer screening. Talk to your health care provider about prostate cancer screening.  Testicular cancer screening is not recommended for adolescents or adult males who have no symptoms. Screening includes self-exam, a health care provider exam, and other  screening tests. Consult with your health care provider about any symptoms you have or any concerns you have about testicular cancer.  Practice safe sex. Use condoms and avoid high-risk sexual practices to reduce the spread of sexually transmitted infections (STIs).  You should be screened for STIs, including gonorrhea and chlamydia if:  You are sexually active and are younger than 24 years.  You are older than 24 years, and your health care provider tells you that you are at risk for this type of infection.  Your sexual activity has changed since you were last screened, and you are at an increased risk for chlamydia or gonorrhea. Ask your health care provider if you are at risk.  If you are at risk of being infected with HIV, it is recommended that you take a prescription medicine daily to prevent HIV infection. This is called pre-exposure prophylaxis (PrEP). You are considered at risk if:  You are a man who has sex with other men (MSM).  You are a heterosexual man who is sexually active with multiple partners.  You take drugs by  injection.  You are sexually active with a partner who has HIV.  Talk with your health care provider about whether you are at high risk of being infected with HIV. If you choose to begin PrEP, you should first be tested for HIV. You should then be tested every 3 months for as long as you are taking PrEP.  Use sunscreen. Apply sunscreen liberally and repeatedly throughout the day. You should seek shade when your shadow is shorter than you. Protect yourself by wearing long sleeves, pants, a wide-brimmed hat, and sunglasses year round whenever you are outdoors.  Tell your health care provider of new moles or changes in moles, especially if there is a change in shape or color. Also, tell your health care provider if a mole is larger than the size of a pencil eraser.  A one-time screening for abdominal aortic aneurysm (AAA) and surgical repair of large AAAs by ultrasound is recommended for men aged 67-75 years who are current or former smokers.  Stay current with your vaccines (immunizations).   This information is not intended to replace advice given to you by your health care provider. Make sure you discuss any questions you have with your health care provider.   Document Released: 04/06/2008 Document Revised: 10/30/2014 Document Reviewed: 03/06/2011 Elsevier Interactive Patient Education [employer].

## 2016-10-04 NOTE — Progress Notes (Signed)
Pre visit review using our clinic review tool, if applicable. No additional management support is needed unless otherwise documented below in the visit note. 

## 2018-01-16 ENCOUNTER — Telehealth: Payer: Self-pay | Admitting: Adult Health

## 2018-01-16 NOTE — Telephone Encounter (Signed)
That is fine with me.

## 2018-01-16 NOTE — Telephone Encounter (Signed)
Copied from [place] [id number]. Topic: General - Other >> Jan 16, 2018  3:30 PM Darl Householder, RMA wrote: Reason for CRM: patient is asking to be released from [facility] and wants to know if [facility] will accept him as a patient he prefers to see a MD, please return pt call for scheduling

## 2018-01-18 NOTE — Telephone Encounter (Signed)
ok 

## 2018-03-13 DIAGNOSIS — R001 Bradycardia, unspecified: Secondary | ICD-10-CM | POA: Diagnosis not present

## 2018-03-13 DIAGNOSIS — R5383 Other fatigue: Secondary | ICD-10-CM | POA: Diagnosis not present

## 2018-03-14 DIAGNOSIS — R001 Bradycardia, unspecified: Secondary | ICD-10-CM | POA: Diagnosis not present

## 2018-04-10 DIAGNOSIS — Z0000 Encounter for general adult medical examination without abnormal findings: Secondary | ICD-10-CM | POA: Diagnosis not present

## 2018-05-20 DIAGNOSIS — R399 Unspecified symptoms and signs involving the genitourinary system: Secondary | ICD-10-CM | POA: Diagnosis not present

## 2020-06-11 ENCOUNTER — Emergency Department (HOSPITAL_COMMUNITY)
Admission: EM | Admit: 2020-06-11 | Discharge: 2020-06-11 | Disposition: A | Discharge status: Home / Self Care | Payer: 59 | Attending: Emergency Medicine | Admitting: Emergency Medicine

## 2020-06-11 ENCOUNTER — Encounter (HOSPITAL_COMMUNITY): Payer: Self-pay

## 2020-06-11 ENCOUNTER — Other Ambulatory Visit (HOSPITAL_COMMUNITY): Payer: Self-pay | Admitting: Oncology

## 2020-06-11 ENCOUNTER — Emergency Department (HOSPITAL_COMMUNITY): Payer: 59

## 2020-06-11 DIAGNOSIS — R202 Paresthesia of skin: Secondary | ICD-10-CM | POA: Insufficient documentation

## 2020-06-11 DIAGNOSIS — Z87891 Personal history of nicotine dependence: Secondary | ICD-10-CM | POA: Diagnosis not present

## 2020-06-11 LAB — COMPREHENSIVE METABOLIC PANEL
ALT: 23 U/L (ref 0–44)
AST: 52 U/L — ABNORMAL HIGH (ref 15–41)
Albumin: 3.8 g/dL (ref 3.5–5.0)
Alkaline Phosphatase: 32 U/L — ABNORMAL LOW (ref 38–126)
Anion gap: 12 (ref 5–15)
BUN: 14 mg/dL (ref 6–20)
CO2: 23 mmol/L (ref 22–32)
Calcium: 8.5 mg/dL — ABNORMAL LOW (ref 8.9–10.3)
Chloride: 89 mmol/L — ABNORMAL LOW (ref 98–111)
Creatinine, Ser: 1.03 mg/dL (ref 0.61–1.24)
GFR calc Af Amer: >60 mL/min (ref >60)
GFR calc non Af Amer: >60 mL/min (ref >60)
Glucose, Bld: 114 mg/dL — ABNORMAL HIGH (ref 70–99)
Potassium: 4.1 mmol/L (ref 3.5–5.1)
Sodium: 124 mmol/L — ABNORMAL LOW (ref 135–145)
Total Bilirubin: 1.4 mg/dL — ABNORMAL HIGH (ref 0.3–1.2)
Total Protein: 7 g/dL (ref 6.5–8.1)

## 2020-06-11 LAB — CBC WITH DIFFERENTIAL/PLATELET
Abs Immature Granulocytes: 0.05 10*3/uL (ref 0.00–0.07)
Basophils Absolute: 0 10*3/uL (ref 0.0–0.1)
Basophils Relative: 0 %
Eosinophils Absolute: 0 10*3/uL (ref 0.0–0.5)
Eosinophils Relative: 0 %
HCT: 37.8 % — ABNORMAL LOW (ref 39.0–52.0)
Hemoglobin: 13.3 g/dL (ref 13.0–17.0)
Immature Granulocytes: 1 %
Lymphocytes Relative: 24 %
Lymphs Abs: 1 10*3/uL (ref 0.7–4.0)
MCH: 29.7 pg (ref 26.0–34.0)
MCHC: 35.2 g/dL (ref 30.0–36.0)
MCV: 84.4 fL (ref 80.0–100.0)
Monocytes Absolute: 0.4 10*3/uL (ref 0.1–1.0)
Monocytes Relative: 8 %
Neutro Abs: 2.9 10*3/uL (ref 1.7–7.7)
Neutrophils Relative %: 67 %
Platelets: 181 10*3/uL (ref 150–400)
RBC: 4.48 MIL/uL (ref 4.22–5.81)
RDW: 11.8 % (ref 11.5–15.5)
WBC: 4.3 10*3/uL (ref 4.0–10.5)
nRBC: 0 % (ref 0.0–0.2)

## 2020-06-11 LAB — URINALYSIS, ROUTINE W REFLEX MICROSCOPIC
Bacteria, UA: NONE SEEN
Bilirubin Urine: NEGATIVE
Glucose, UA: NEGATIVE mg/dL
Ketones, ur: NEGATIVE mg/dL
Leukocytes,Ua: NEGATIVE
Nitrite: NEGATIVE
Protein, ur: NEGATIVE mg/dL
Specific Gravity, Urine: 1.003 — ABNORMAL LOW (ref 1.005–1.030)
pH: 6 (ref 5.0–8.0)

## 2020-06-11 LAB — LACTIC ACID, PLASMA: Lactic Acid, Venous: 1 mmol/L (ref 0.5–1.9)

## 2020-06-11 MED ORDER — DOXYCYCLINE HYCLATE 100 MG PO CAPS: 100.0000 mg | ORAL_CAPSULE | Freq: Two times a day (BID) | ORAL | 0 refills | Status: AC

## 2020-06-11 MED ORDER — ACETAMINOPHEN 325 MG PO TABS
650.0000 mg | ORAL_TABLET | Freq: Once | ORAL | Status: AC | PRN
  Administered 2020-06-11: 650 mg via ORAL

## 2020-06-11 NOTE — Discharge Instructions (Addendum)
Please follow up with your PCP if symptoms worsen.  You will need to cut back on your water intake to 2 Liters a day as your sodium level is a little low.  You can tylenol for increased temperature.  I have sent a prescription for Doxycycline to take twice a day for 10 days.  Please use sunscreen when out as this medication can cause skin sensitivities.  If you have worsening symptoms return to the ED

## 2020-06-11 NOTE — ED Provider Notes (Signed)
I saw and evaluated the patient, reviewed the resident's note and I agree with the findings and plan.  EKG:   54 year old male diagnosed with Covid recently patient presents with intermittent bilateral lower extremity paresthesias worse at night.  Neurological exam showed he is able to ambulate without any trouble.  Has had a low-grade temperature.  Chest x-ray shows residual Covid.  Will start on antibiotics and referred to neurology.   Lacretia Leigh, MD 06/11/20 1700

## 2020-06-11 NOTE — ED Provider Notes (Signed)
[facility] DEPT Provider Note   CSN: [id number] Arrival date & time: 06/11/20  0024     History Chief Complaint  Patient presents with  . Numbness    Bruce [name] is a 54 y.o. male.  Reports poor circulation in lower legs x 2 days.  Denies any pain but reports lower extremity restless and uncomfortable.  Worse at night when laying down and has not been able to get good sleep.  He reports hving to get up throughout the night to get some relief. Reports coolness of both feet and unable to get warm.  Denies any leg swelling or pain.  No numbness or weakness in upper or lower extremities.  Denies any nausea, vomiting or diarrhea.  Does report history of lower back pain..  No incontinence of bowel or bladder.  Has not taken any Tylenol or Ibuprofen.          Past Medical History:  Diagnosis Date  . Erectile dysfunction   . Impotence of organic origin   . STD (sexually transmitted disease)    Previous history of gonorrhea, chlamydia and genital warts  . Substance abuse ([place]) 1995    Patient Active Problem List   Diagnosis Date Noted  . Testicular pain 02/24/2015  . Preventative health care 02/11/2014  . PREMATURE EJACULATION 12/28/2010  . SHOULDER PAIN, BILATERAL 07/06/2010  . NEUTROPENIA UNSPECIFIED 01/30/2010  . THROMBOCYTOPENIA 01/19/2010  . ALLERGIC RHINITIS 01/19/2010  . HEMORRHOIDS 10/27/2009  . 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 FATIGUE, CHRONIC 10/27/2009    Past Surgical History:  Procedure Laterality Date  . hemorrhoid     Patient reports he probably had sclerosis done  . laceration of head  1991  . VASECTOMY         Family History  Problem Relation Age of Onset  . Kidney disease Father        due to drug use  . Coronary artery disease Maternal Grandfather   . Hypertension Other   . Cancer Other        Uncle-Leukemia  . Lupus Other        Aunt    Social History   Tobacco Use  . Smoking status:  Former Smoker    Packs/day: 0.50    Quit date: 08/24/1994    Years since quitting: 25.8  . Smokeless tobacco: Never Used  . Tobacco comment: Ex-smoker quit 20 years ago  Substance Use Topics  . Alcohol use: No    Comment: rarely/glass of wine. 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 Drug use: No    Comment: previous h/o marijuana and cocaine use -sober 20 years      Home Medications Prior to Admission medications   Medication Sig Start Date End Date Taking? Authorizing Provider  doxycycline (VIBRAMYCIN) 100 MG capsule One capsule, two times per day for one week during flares 10/04/16   Nafziger, Tommi Rumps, NP  tadalafil (CIALIS) 10 MG tablet Take 10 mg by mouth daily as needed for erectile dysfunction.    [provider]    Allergies    Patient has no known allergies.  Review of Systems   Review of Systems  Constitutional: Positive for fever.  Respiratory: Positive for cough. Negative for chest tightness, shortness of breath and wheezing.   Cardiovascular: Negative for chest pain and leg swelling.  Gastrointestinal: Negative for diarrhea, nausea and vomiting.  Genitourinary: Negative for difficulty urinating and hematuria.  Musculoskeletal: Positive for back pain.  Skin: Negative for pallor.  Neurological: Negative for weakness, light-headedness and numbness.    Physical Exam Updated Vital Signs BP 121/84 (BP Location: Left Arm)   Pulse 67   Temp 100 F (37.8 C) (Oral)   Resp 16   Ht 5\' 9"  (1.753 m)   Wt 74.8 kg   SpO2 96%   BMI 24.37 kg/m   Physical Exam Constitutional:      Appearance: Normal appearance. He is normal weight.  HENT:     Head: Normocephalic.     Nose: Nose normal.     Mouth/Throat:     Mouth: Mucous membranes are moist.  Eyes:     Extraocular Movements: Extraocular movements intact.     Conjunctiva/sclera: Conjunctivae normal.     Pupils: Pupils are equal, round, and reactive to light.  Cardiovascular:     Rate and Rhythm:  Normal rate and regular rhythm.     Pulses: Normal pulses.     Heart sounds: Normal heart sounds.  Pulmonary:     Effort: Pulmonary effort is normal.     Breath sounds: Normal breath sounds.  Abdominal:     General: Abdomen is flat. Bowel sounds are normal.     Palpations: Abdomen is soft.  Musculoskeletal:        General: No swelling, tenderness or signs of injury.     Cervical back: Normal range of motion.     Right lower leg: No edema.     Left lower leg: No edema.  Skin:    Coloration: Skin is not pale.     Comments: Toes are cool to touch bilaterally  Neurological:     General: No focal deficit present.     Mental Status: He is alert and oriented to person, place, and time.     Sensory: No sensory deficit.     Motor: No weakness.     ED Results / Procedures / Treatments   Labs (all labs ordered are listed, but only abnormal results are displayed) Labs Reviewed  COMPREHENSIVE METABOLIC PANEL - Abnormal; Notable for the following components:      Result Value   Sodium 124 (*)    Chloride 89 (*)    Glucose, Bld 114 (*)    Calcium 8.5 (*)    AST 52 (*)    Alkaline Phosphatase 32 (*)    Total Bilirubin 1.4 (*)    All other components within normal limits  CBC WITH DIFFERENTIAL/PLATELET - Abnormal; Notable for the following components:   HCT 37.8 (*)    All other components within normal limits  URINALYSIS, ROUTINE W REFLEX MICROSCOPIC - Abnormal; Notable for the following components:   Color, Urine STRAW (*)    Specific Gravity, Urine 1.003 (*)    Hgb urine dipstick MODERATE (*)    All other components within normal limits  LACTIC ACID, PLASMA    EKG None  Radiology DG Chest 2 View  Result Date: 06/11/2020 CLINICAL DATA:  Cough and fever.  COVID positive. EXAM: CHEST - 2 VIEW COMPARISON:  None. FINDINGS: Patchy opacities in the periphery of both lung bases. The heart is normal in size. Normal mediastinal contours. No pleural fluid or pneumothorax. No pulmonary  edema. No acute osseous abnormalities are seen. IMPRESSION: Patchy opacities in the periphery of both lung bases, pattern consistent with COVID-19 pneumonia. Electronically Signed   By: Keith Rake M.D.   On: 06/11/2020 01:27    Procedures Procedures (including critical care time)  Medications Ordered in ED  Medications  acetaminophen (TYLENOL) tablet 650 mg (has no administration in time range)    ED Course  I have reviewed the triage vital signs and the nursing notes.  Pertinent labs & imaging results that were available during my care of the patient were reviewed by me and considered in my medical decision making (see chart for details).    MDM Rules/Calculators/A&P                          Bruce [name] is a 54 y.o. male presents with bilateral lower extremities numbness.  He tested positive for COVID 08/05.  Vital signs stable but while in Triage he was noted to have elevated temperature 102F.  Chest xray consistent with COVID pneumonia.  Labs significant for mild hyponatremia and no leukocytosis.  No red flags.  Neuro exam negative for focal deficits.  Differentials considered GBS given recent viral infection but neuro exam finding wnl. Also considered Spinal compression but lower suspicion given no red flags.  Other possible etiologies considered restless leg given worsening symptoms at night, Hbg wnl.  Given increase in temperature and outside COVID infectious period could be underlying bacterial pneumonia.  Will treat with Doxycycline 100 mg BID x 10 days.  Advise to fluid restrict for mild hyponatremia.  Patient advised to follow up with PCP in 1 week or sooner if symptoms worsen.  Referral to Neurology, patient will call for appointment.  Patient stable and ready for discharge.  Final Clinical Impression(s) / ED Diagnoses Final diagnoses:  Paresthesia    Rx / DC Orders ED Discharge Orders    None       Carollee Leitz, MD 06/11/20 Jamal Maes    Lacretia Leigh,  MD 06/15/20 [phone]

## 2020-06-11 NOTE — ED Notes (Signed)
Save blue in main lab 

## 2020-06-11 NOTE — ED Notes (Signed)
Patient vitals changing while in lobby. Will be roomed at this time.

## 2020-06-11 NOTE — ED Triage Notes (Signed)
Pt reports bilateral leg numbness since he was dx'd with COVID on 8/9. He states that his symptoms started on the 5th and that he was done with his quarantine on 8/15. Pt states that he feels better, but was found to have a fever of 102 in triage. A&Ox4.

## 2020-10-08 IMAGING — CR DG CHEST 2V
2 series · 2 of 2 positions shown · non-contrast
Comparison: None.

CLINICAL DATA: Cough and fever.  COVID positive.

EXAM:
CHEST - 2 VIEW

[w chest pa]
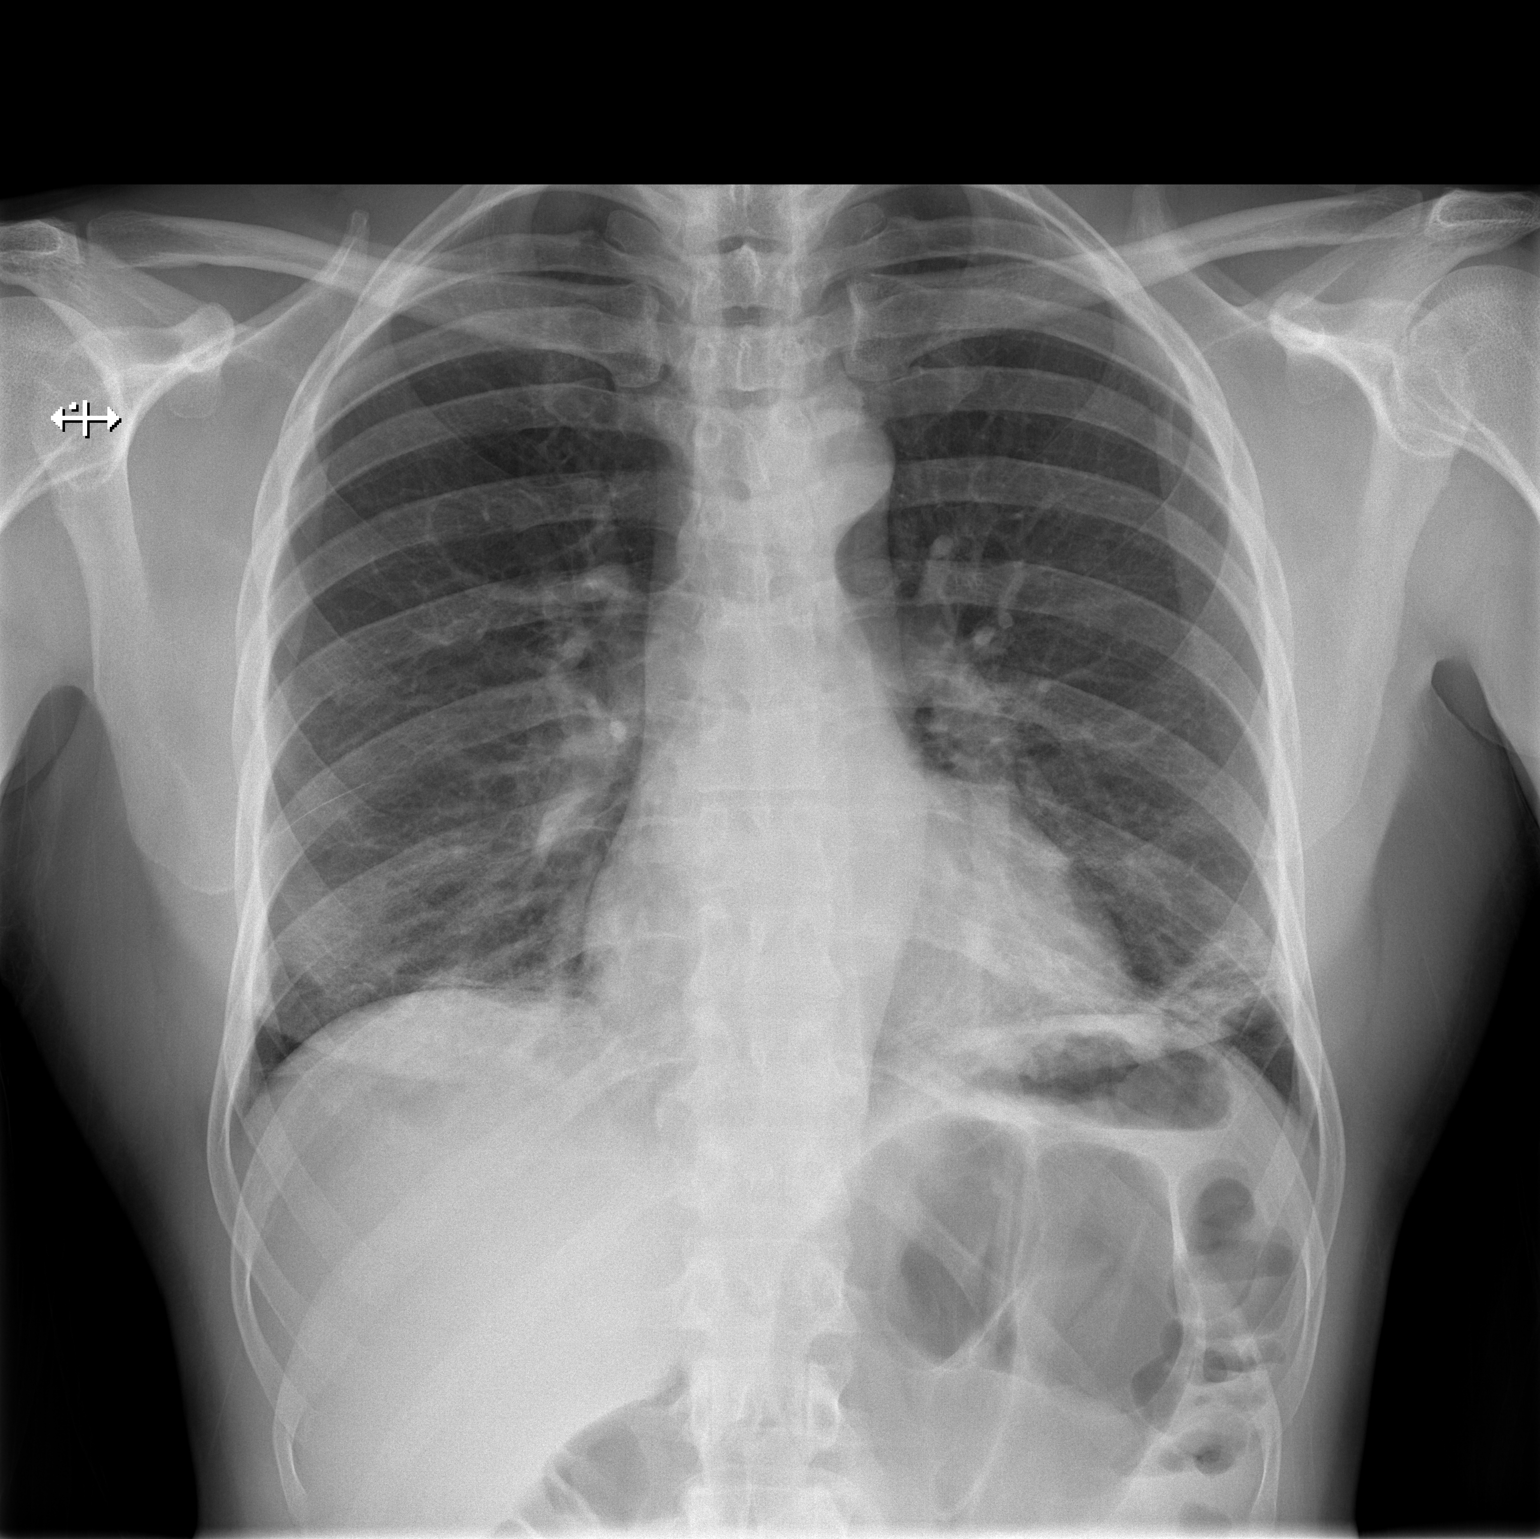

[w chest lat]
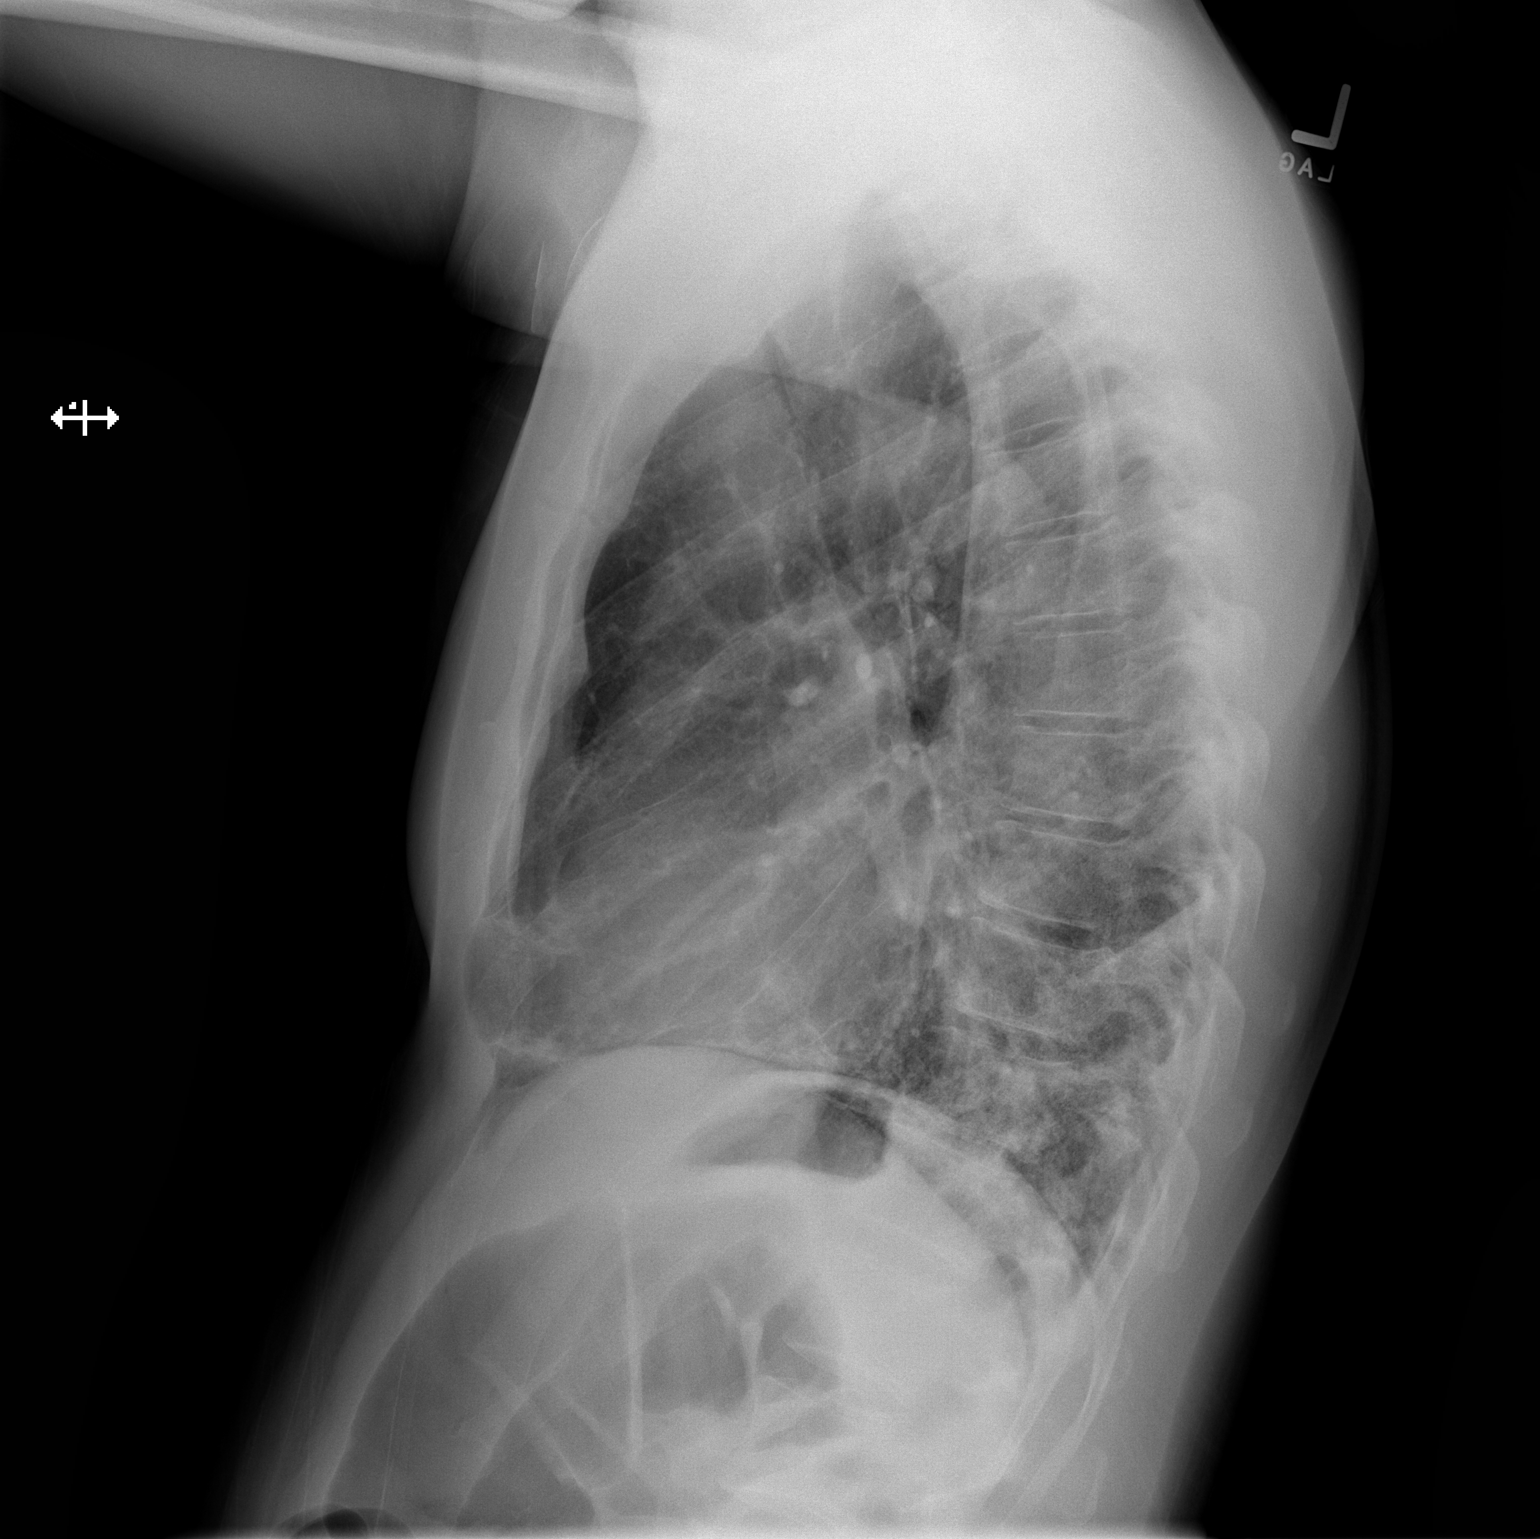

[2 of 2 positions shown; findings below may reference images not displayed]

FINDINGS: Patchy opacities in the periphery of both lung bases. The heart is
normal in size. Normal mediastinal contours. No pleural fluid or
pneumothorax. No pulmonary edema. No acute osseous abnormalities are
seen.
IMPRESSION: Patchy opacities in the periphery of both lung bases, pattern
consistent with [id number] pneumonia.
